# Patient Record
Sex: Female | Born: 1980
Health system: Southern US, Community
[De-identification: ages and names within clinical notes are randomized; demographics above are authoritative.]

## PROBLEM LIST (undated history)

## (undated) DIAGNOSIS — Z9889 Other specified postprocedural states: Secondary | ICD-10-CM

## (undated) DIAGNOSIS — R112 Nausea with vomiting, unspecified: Secondary | ICD-10-CM

## (undated) DIAGNOSIS — D649 Anemia, unspecified: Secondary | ICD-10-CM

## (undated) DIAGNOSIS — Z789 Other specified health status: Secondary | ICD-10-CM

## (undated) DIAGNOSIS — R011 Cardiac murmur, unspecified: Secondary | ICD-10-CM

## (undated) DIAGNOSIS — K219 Gastro-esophageal reflux disease without esophagitis: Secondary | ICD-10-CM

## (undated) DIAGNOSIS — I82409 Acute embolism and thrombosis of unspecified deep veins of unspecified lower extremity: Secondary | ICD-10-CM

## (undated) DIAGNOSIS — J302 Other seasonal allergic rhinitis: Secondary | ICD-10-CM

## (undated) HISTORY — PX: TUBAL LIGATION: SHX77

## (undated) HISTORY — PX: OTHER SURGICAL HISTORY: SHX169

---

## 2008-04-16 ENCOUNTER — Other Ambulatory Visit: Admission: RE | Admit: 2008-04-16 | Discharge: 2008-04-16 | Payer: Self-pay | Admitting: Obstetrics and Gynecology

## 2016-01-06 ENCOUNTER — Other Ambulatory Visit: Payer: Self-pay

## 2016-01-06 DIAGNOSIS — Z1231 Encounter for screening mammogram for malignant neoplasm of breast: Secondary | ICD-10-CM

## 2016-02-10 ENCOUNTER — Ambulatory Visit: Payer: Self-pay

## 2016-02-13 DIAGNOSIS — N915 Oligomenorrhea, unspecified: Secondary | ICD-10-CM | POA: Diagnosis not present

## 2016-02-17 ENCOUNTER — Ambulatory Visit
Admission: RE | Admit: 2016-02-17 | Discharge: 2016-02-17 | Disposition: A | Discharge status: Home / Self Care | Payer: BLUE CROSS/BLUE SHIELD | Source: Ambulatory Visit

## 2016-02-17 DIAGNOSIS — Z1231 Encounter for screening mammogram for malignant neoplasm of breast: Secondary | ICD-10-CM | POA: Diagnosis not present

## 2016-06-05 DIAGNOSIS — O3481 Maternal care for other abnormalities of pelvic organs, first trimester: Secondary | ICD-10-CM | POA: Diagnosis not present

## 2016-06-05 DIAGNOSIS — O30191 Triplet pregnancy, unable to determine number of placenta and number of amniotic sacs, first trimester: Secondary | ICD-10-CM | POA: Diagnosis not present

## 2016-06-05 DIAGNOSIS — Z36 Encounter for antenatal screening of mother: Secondary | ICD-10-CM | POA: Diagnosis not present

## 2016-06-05 DIAGNOSIS — O30121 Triplet pregnancy with two or more monoamniotic fetuses, first trimester: Secondary | ICD-10-CM | POA: Diagnosis not present

## 2016-06-05 DIAGNOSIS — Z3A01 Less than 8 weeks gestation of pregnancy: Secondary | ICD-10-CM | POA: Diagnosis not present

## 2016-06-15 DIAGNOSIS — O30191 Triplet pregnancy, unable to determine number of placenta and number of amniotic sacs, first trimester: Secondary | ICD-10-CM | POA: Diagnosis not present

## 2016-06-15 DIAGNOSIS — Z3A08 8 weeks gestation of pregnancy: Secondary | ICD-10-CM | POA: Diagnosis not present

## 2016-06-15 DIAGNOSIS — O30121 Triplet pregnancy with two or more monoamniotic fetuses, first trimester: Secondary | ICD-10-CM | POA: Diagnosis not present

## 2016-06-16 ENCOUNTER — Other Ambulatory Visit (HOSPITAL_COMMUNITY): Payer: Self-pay | Admitting: Unknown Physician Specialty

## 2016-06-16 DIAGNOSIS — Z3A16 16 weeks gestation of pregnancy: Secondary | ICD-10-CM

## 2016-06-16 DIAGNOSIS — Z3689 Encounter for other specified antenatal screening: Secondary | ICD-10-CM

## 2016-06-16 DIAGNOSIS — O09522 Supervision of elderly multigravida, second trimester: Secondary | ICD-10-CM

## 2016-07-08 DIAGNOSIS — Z3682 Encounter for antenatal screening for nuchal translucency: Secondary | ICD-10-CM | POA: Diagnosis not present

## 2016-07-08 DIAGNOSIS — Z3A11 11 weeks gestation of pregnancy: Secondary | ICD-10-CM | POA: Diagnosis not present

## 2016-07-08 DIAGNOSIS — O30121 Triplet pregnancy with two or more monoamniotic fetuses, first trimester: Secondary | ICD-10-CM | POA: Diagnosis not present

## 2016-07-08 DIAGNOSIS — Z369 Encounter for antenatal screening, unspecified: Secondary | ICD-10-CM | POA: Diagnosis not present

## 2016-07-08 DIAGNOSIS — O30101 Triplet pregnancy, unspecified number of placenta and unspecified number of amniotic sacs, first trimester: Secondary | ICD-10-CM | POA: Diagnosis not present

## 2016-07-08 DIAGNOSIS — O09511 Supervision of elderly primigravida, first trimester: Secondary | ICD-10-CM | POA: Diagnosis not present

## 2016-07-08 DIAGNOSIS — Z3A12 12 weeks gestation of pregnancy: Secondary | ICD-10-CM | POA: Diagnosis not present

## 2016-07-27 ENCOUNTER — Encounter (HOSPITAL_COMMUNITY): Payer: Self-pay | Admitting: Unknown Physician Specialty

## 2016-08-06 ENCOUNTER — Encounter (HOSPITAL_COMMUNITY): Payer: Self-pay

## 2016-08-07 ENCOUNTER — Encounter (HOSPITAL_COMMUNITY): Payer: Self-pay

## 2016-08-07 ENCOUNTER — Other Ambulatory Visit (HOSPITAL_COMMUNITY): Payer: Self-pay | Admitting: *Deleted

## 2016-08-07 ENCOUNTER — Ambulatory Visit (HOSPITAL_COMMUNITY)
Admission: RE | Admit: 2016-08-07 | Discharge: 2016-08-07 | Disposition: A | Discharge status: Home / Self Care | Payer: BLUE CROSS/BLUE SHIELD | Source: Ambulatory Visit | Attending: Unknown Physician Specialty | Admitting: Unknown Physician Specialty

## 2016-08-07 DIAGNOSIS — Z3A16 16 weeks gestation of pregnancy: Secondary | ICD-10-CM | POA: Insufficient documentation

## 2016-08-07 DIAGNOSIS — Z3689 Encounter for other specified antenatal screening: Secondary | ICD-10-CM

## 2016-08-07 DIAGNOSIS — O09522 Supervision of elderly multigravida, second trimester: Secondary | ICD-10-CM | POA: Diagnosis not present

## 2016-08-07 DIAGNOSIS — O30119 Triplet pregnancy with two or more monochorionic fetuses, unspecified trimester: Secondary | ICD-10-CM

## 2016-08-07 DIAGNOSIS — Z315 Encounter for genetic counseling: Secondary | ICD-10-CM | POA: Diagnosis not present

## 2016-08-07 DIAGNOSIS — Z148 Genetic carrier of other disease: Secondary | ICD-10-CM

## 2016-08-07 DIAGNOSIS — O30112 Triplet pregnancy with two or more monochorionic fetuses, second trimester: Secondary | ICD-10-CM | POA: Diagnosis not present

## 2016-08-07 DIAGNOSIS — O09529 Supervision of elderly multigravida, unspecified trimester: Secondary | ICD-10-CM

## 2016-08-07 HISTORY — DX: Other specified health status: Z78.9

## 2016-08-07 NOTE — Addendum Note (Signed)
Encounter addended by: Allegra LaiMark Glidwell Newman, MD on: 08/07/2016  4:56 PM<BR>    Actions taken: Sign clinical note

## 2016-08-07 NOTE — Progress Notes (Signed)
Maternal Fetal Medicine Consultation  Requesting Provider(s): Buist  Primary Ob: BuistReason for consultation: Triplet gestation  HPI: 35yo P2002 at 16+0 weeks with what appears to be a triamniotic dichorionic triplet gestation, with A & C being the monochorionic pain (see US report) these are spontaneous triplets. She has had about a 8-9 pound weight gain so far. She has had no problems with the pregnancy so far, and reports good fetal movement OB History: OB History    Gravida Para Term Preterm AB Living   3 2 2     2    SAB TAB Ectopic Multiple Live Births                Both of her pregnancies were uncomplicated, with no PIH or gestational diabetes  PMH:  Past Medical History:  Diagnosis Date  . Medical history non-contributory     PSH:  Past Surgical History:  Procedure Laterality Date  . bunionectomies     x2   Meds: PNV, promethazine Allergies: codeine Fh: Both children have Pendred Syndrome  Soc: No tocacco, ETOH or illicit drugs  Review of Systems: no vaginal bleeding or cramping/contractions, no LOF, no nausea/vomiting. All other systems reviewed and are negative.  Pe: See EPIC section for VS and PE    Please see separate document for fetal ultrasound report.  A/P: Triamniotic diamniotic triplet gestation I had a long discussion with the patient and her partner, discussing the various areas on increased risk for pregnancy complications. Chief among these is the risk for twin-to-twin transfusion between A & C. This will require visits for US evaluation every 2 weeks, with growth evaluated every other visit (every 4 weeks). BPP weekly at 28 weeks should be strongly considered.Should greater than stage 1 TTTS develop she would require evaluation by a high-volume fetal surgery practice. The risk for this is between 10-15%. She is at greater risk for preeclampsia, so I have asked her to start 81mg  of aspirin daily, starting today. Baseline labs should be obtained for  urinary protein and liver function test. She is also at greater risk for gestational diabetes. I would recommend a one hour GTT as soon as possible, with a repeat at 26 weeks if negative. She is at a higher risk for preterm delivery, but unfortunately no currently available interventions are shown to work in higher order multiples. Her weight gain is inadequate. For her BMI a total weight gain of 54 pounds is appropriate. I encouraged her to increase her intake of high density high quality calories with a good percentage of protein. Should there be no complications, delivery should be planned for between 35+0 and 35+6 weeks. After a betamethasone course.  Thank you for the opportunity to be a part of the care of Ann FreshwaterJennifer Leach. Please contact our office if we can be of further assistance.   I spent approximately 40 minutes with this patient with over 50% of time spent in face-to-face counseling.

## 2016-08-07 NOTE — Progress Notes (Signed)
Genetic Counseling  High-Risk Gestation Note  Appointment Date:  08/07/2016 Referred By: Ernestina PennaBuist, Nigel, MD Date of Birth:  Feb 18, 1981 Partner:  Miki KinsJason Ent   Pregnancy History: [id number]: G3P2002 Estimated Date of Delivery: 01/22/17 Estimated Gestational Age: 10223w0d Attending: Charlsie MerlesMark Newman, MD  Mrs. Ova FreshwaterJennifer Mowers and her husband, Mr. Miki KinsJason Hail, were seen for genetic counseling because of a maternal age of [age] y.o. in a dichorionic triamniotic triplet gestation.      In summary:  Discussed AMA and associated risk for fetal aneuploidy  Discussed options for screening  NIPS-declined   Ultrasound- performed today, follow-up ultrasounds scheduled  Discussed diagnostic testing options  Amniocentesis-declined  Reviewed family history concerns  Previous two biological daughters with Pendred syndrome, [facility]  Family history not reviewed in detail given that the family is followed by medical genetics  Reviewed autosomal recessive inheritance and 25% recurrence risk for current pregnancy   Couple had previously decided against prenatal diagnosis for Pendred syndrome in pregnancy  They plan to pursue postnatal evaluation for Pendred syndrome  Discussed carrier screening options (CF, SMA, hemoglobinopathies) - patient declined  They were counseled regarding maternal age and the association with risk for chromosome conditions due to nondisjunction with aging of the ova.   We reviewed chromosomes, nondisjunction, and the associated 1 in 8456 risk for fetal aneuploidy related to a maternal age of 35 y.o. at 2823w0d in a multifetal gestation. Regarding genetic information, we discussed that the monochorionic, diamniotic pair in the pregnancy would be expected to share the same chromosome information.  They were counseled that the risk for aneuploidy decreases as gestational age increases, accounting for those pregnancies which spontaneously abort.  We  specifically discussed Down syndrome (trisomy 6121), trisomies 2713 and 4018, and sex chromosome aneuploidies (47,XXX and 47,XXY) including the common features and prognoses of each.   We reviewed available screening options including noninvasive prenatal screening (NIPS)/cell free DNA (cfDNA) screening and detailed ultrasound.  We discussed the maternal serum screening is not available in a triplet gestation. They were counseled that screening tests are used to modify a patient's a priori risk for aneuploidy, typically based on age. This estimate provides a pregnancy specific risk assessment. We reviewed the benefits and limitations of each option. Specifically, we discussed the conditions for which each test screens, the detection rates, and false positive rates of each. We reviewed that while technically validated in a triplet gestation through [facility], performance data for NIPS in a triplet gestation is very limited. Sensitivity and specificity would be expected to be decreased compared to a singleton gestation. They understand that NIPS does not provide separate risk assessment for each fetus but rather risk assessment for each chromosome conditions to the pregnancy as a whole. They were also counseled regarding diagnostic testing via amniocentesis. We reviewed the approximate 1 in 300-500 risk for complications from amniocentesis, including spontaneous pregnancy loss. We discussed the possible results that the tests might provide including: positive, negative, unanticipated, and no result. Finally, they were counseled regarding the cost of each option and potential out of pocket expenses. After consideration of all the options, they elected to proceed with ultrasound only and declined NIPS and amniocentesis.     A complete ultrasound was performed today. The ultrasound report will be sent under separate cover. There were no visualized fetal anomalies or markers suggestive of aneuploidy, but  ultrasound was limited by gestational age. They understand that screening tests cannot rule out all birth defects or genetic syndromes.  The patient was advised of this limitation and states she still does not want additional testing at this time. Follow-up ultrasounds are planned for 11/27 and 12/08.   Mrs. Ova FreshwaterJennifer Madonia was provided with written information regarding cystic fibrosis (CF), spinal muscular atrophy (SMA) and hemoglobinopathies including the carrier frequency, availability of carrier screening and prenatal diagnosis if indicated.  In addition, we discussed that CF and hemoglobinopathies are routinely screened for as part of the [place] newborn screening panel.  She declined screening for CF, SMA and hemoglobinopathies.  Both family histories were briefly reviewed and found to be contributory for Pendred syndrome for the couple's two biological daughters. Their [facility] are [facility]. They also reported that their adopted daughter has hearing loss due to microtia. Given this family's close follow-up and no recent changes to the family history, the couple declined detailed family history intake at this time.  Pendred syndrome is part of a phenotypic spectrum of sensorineural hearing loss (SNHL), which is typically congenital. Additional features of Pendred syndrome include vestibular dysfunction, temporal bone abnormalities, and increased risk for development of euthyroid goiter.  Pendred syndrome is an autosomal recessive genetic condition. The majority of individuals have homozygous pathogenic variants in SLC26A4, but some individuals are found to have pathogenic variants in SLC26A4 in combination with pathogenic variants in either FOXI1 or KCNJ10. The couple was familiar with autosomal recessive inheritance, where both parents who are carriers for an autosomal recessive condition have an independent 1 in 4 (25%) chance for a pregnancy inheriting both copies of the  pathogenic gene changes. Additionally, each pregnancy together has 1 in 2 (50%) chance to be a carrier, and a 1 in 4 (25%) chance to be neither a carrier nor affected. In the current triplet gestation for the couple, they understand that the monochorionic,diamniotic fetus pair would be expected to have the same genetic information, and the third fetus would have a separate 25% risk for Pendred syndrome.   The couple discussed the 25% recurrence risk for Pendred syndrome with the pediatric genetic counselor at [facility] and had previously decided to decline prenatal diagnosis for Pendred syndrome. They plan for postnatal evaluation for Pendred syndrome at this time.  Without further information regarding the provided family history, an accurate genetic risk cannot be calculated. Further genetic counseling is warranted if more information is obtained.  Mrs. Ova FreshwaterJennifer Rippetoe denied exposure to environmental toxins or chemical agents. She denied the use of alcohol, tobacco or street drugs. She denied significant viral illnesses during the course of her pregnancy. Her medical and surgical histories were otherwise noncontributory.   I counseled this couple regarding the above risks and available options.  The approximate face-to-face time with the genetic counselor was 35 minutes.  Quinn PlowmanKaren Venissa Nappi, MS,  Certified Genetic Counselor 08/07/2016

## 2016-08-07 NOTE — Progress Notes (Signed)
Addendum: the first line of assessment/plan should read "triamniotic Dichorionic"

## 2016-08-10 ENCOUNTER — Encounter (HOSPITAL_COMMUNITY): Payer: Self-pay

## 2016-08-10 DIAGNOSIS — Z3A16 16 weeks gestation of pregnancy: Secondary | ICD-10-CM | POA: Insufficient documentation

## 2016-08-10 DIAGNOSIS — O09529 Supervision of elderly multigravida, unspecified trimester: Secondary | ICD-10-CM | POA: Insufficient documentation

## 2016-08-21 ENCOUNTER — Ambulatory Visit (HOSPITAL_COMMUNITY): Payer: BLUE CROSS/BLUE SHIELD

## 2016-08-24 ENCOUNTER — Ambulatory Visit (HOSPITAL_COMMUNITY)
Admission: RE | Admit: 2016-08-24 | Discharge: 2016-08-24 | Disposition: A | Discharge status: Home / Self Care | Payer: BLUE CROSS/BLUE SHIELD | Source: Ambulatory Visit | Attending: Unknown Physician Specialty | Admitting: Unknown Physician Specialty

## 2016-08-24 ENCOUNTER — Other Ambulatory Visit (HOSPITAL_COMMUNITY): Payer: Self-pay | Admitting: Obstetrics and Gynecology

## 2016-08-24 ENCOUNTER — Other Ambulatory Visit (HOSPITAL_COMMUNITY): Payer: Self-pay | Admitting: *Deleted

## 2016-08-24 ENCOUNTER — Encounter (HOSPITAL_COMMUNITY): Payer: Self-pay

## 2016-08-24 DIAGNOSIS — O30112 Triplet pregnancy with two or more monochorionic fetuses, second trimester: Secondary | ICD-10-CM | POA: Diagnosis not present

## 2016-08-24 DIAGNOSIS — Z8489 Family history of other specified conditions: Secondary | ICD-10-CM | POA: Diagnosis not present

## 2016-08-24 DIAGNOSIS — Z3A18 18 weeks gestation of pregnancy: Secondary | ICD-10-CM | POA: Diagnosis not present

## 2016-08-24 DIAGNOSIS — O30119 Triplet pregnancy with two or more monochorionic fetuses, unspecified trimester: Secondary | ICD-10-CM

## 2016-08-24 DIAGNOSIS — O09522 Supervision of elderly multigravida, second trimester: Secondary | ICD-10-CM | POA: Diagnosis not present

## 2016-09-04 ENCOUNTER — Encounter (HOSPITAL_COMMUNITY): Payer: Self-pay

## 2016-09-04 ENCOUNTER — Other Ambulatory Visit (HOSPITAL_COMMUNITY): Payer: Self-pay | Admitting: Obstetrics and Gynecology

## 2016-09-04 ENCOUNTER — Ambulatory Visit (HOSPITAL_COMMUNITY)
Admission: RE | Admit: 2016-09-04 | Discharge: 2016-09-04 | Disposition: A | Discharge status: Home / Self Care | Payer: BLUE CROSS/BLUE SHIELD | Source: Ambulatory Visit | Attending: Unknown Physician Specialty | Admitting: Unknown Physician Specialty

## 2016-09-04 DIAGNOSIS — Z3A20 20 weeks gestation of pregnancy: Secondary | ICD-10-CM

## 2016-09-04 DIAGNOSIS — O09522 Supervision of elderly multigravida, second trimester: Secondary | ICD-10-CM | POA: Diagnosis not present

## 2016-09-04 DIAGNOSIS — Z362 Encounter for other antenatal screening follow-up: Secondary | ICD-10-CM

## 2016-09-04 DIAGNOSIS — O30119 Triplet pregnancy with two or more monochorionic fetuses, unspecified trimester: Secondary | ICD-10-CM

## 2016-09-04 DIAGNOSIS — O30112 Triplet pregnancy with two or more monochorionic fetuses, second trimester: Secondary | ICD-10-CM | POA: Diagnosis not present

## 2016-09-18 ENCOUNTER — Encounter (HOSPITAL_COMMUNITY): Payer: Self-pay

## 2016-09-18 ENCOUNTER — Ambulatory Visit (HOSPITAL_COMMUNITY)
Admission: RE | Admit: 2016-09-18 | Discharge: 2016-09-18 | Disposition: A | Discharge status: Home / Self Care | Payer: BLUE CROSS/BLUE SHIELD | Source: Ambulatory Visit | Attending: Unknown Physician Specialty | Admitting: Unknown Physician Specialty

## 2016-09-18 DIAGNOSIS — Z3A22 22 weeks gestation of pregnancy: Secondary | ICD-10-CM | POA: Diagnosis not present

## 2016-09-18 DIAGNOSIS — O30112 Triplet pregnancy with two or more monochorionic fetuses, second trimester: Secondary | ICD-10-CM | POA: Insufficient documentation

## 2016-09-18 NOTE — Addendum Note (Signed)
Encounter addended by: Emeline DarlingKasie E Wrigley Winborne on: 09/18/2016 12:00 PM<BR>    Actions taken: Imaging Exam ended

## 2016-10-02 ENCOUNTER — Other Ambulatory Visit (HOSPITAL_COMMUNITY): Payer: Self-pay | Admitting: Maternal and Fetal Medicine

## 2016-10-02 ENCOUNTER — Ambulatory Visit (HOSPITAL_COMMUNITY)
Admission: RE | Admit: 2016-10-02 | Discharge: 2016-10-02 | Disposition: A | Discharge status: Home / Self Care | Payer: BLUE CROSS/BLUE SHIELD | Source: Ambulatory Visit | Attending: Unknown Physician Specialty | Admitting: Unknown Physician Specialty

## 2016-10-02 ENCOUNTER — Encounter (HOSPITAL_COMMUNITY): Payer: Self-pay

## 2016-10-02 DIAGNOSIS — O30112 Triplet pregnancy with two or more monochorionic fetuses, second trimester: Secondary | ICD-10-CM | POA: Insufficient documentation

## 2016-10-02 DIAGNOSIS — O09522 Supervision of elderly multigravida, second trimester: Secondary | ICD-10-CM

## 2016-10-02 DIAGNOSIS — O30122 Triplet pregnancy with two or more monoamniotic fetuses, second trimester: Secondary | ICD-10-CM

## 2016-10-02 DIAGNOSIS — Z3A24 24 weeks gestation of pregnancy: Secondary | ICD-10-CM

## 2016-10-02 DIAGNOSIS — Z8489 Family history of other specified conditions: Secondary | ICD-10-CM

## 2016-10-16 ENCOUNTER — Ambulatory Visit (HOSPITAL_COMMUNITY)
Admission: RE | Admit: 2016-10-16 | Discharge: 2016-10-16 | Disposition: A | Discharge status: Home / Self Care | Payer: BLUE CROSS/BLUE SHIELD | Source: Ambulatory Visit | Attending: Unknown Physician Specialty | Admitting: Unknown Physician Specialty

## 2016-10-19 ENCOUNTER — Ambulatory Visit (HOSPITAL_COMMUNITY)
Admission: RE | Admit: 2016-10-19 | Discharge: 2016-10-19 | Disposition: A | Discharge status: Home / Self Care | Payer: BLUE CROSS/BLUE SHIELD | Source: Ambulatory Visit | Attending: Unknown Physician Specialty | Admitting: Unknown Physician Specialty

## 2016-10-19 ENCOUNTER — Encounter (HOSPITAL_COMMUNITY): Payer: Self-pay

## 2016-10-19 DIAGNOSIS — O30112 Triplet pregnancy with two or more monochorionic fetuses, second trimester: Secondary | ICD-10-CM | POA: Diagnosis not present

## 2016-10-19 DIAGNOSIS — Z3A26 26 weeks gestation of pregnancy: Secondary | ICD-10-CM | POA: Insufficient documentation

## 2016-10-19 DIAGNOSIS — O09522 Supervision of elderly multigravida, second trimester: Secondary | ICD-10-CM | POA: Diagnosis not present

## 2016-10-28 DIAGNOSIS — O30102 Triplet pregnancy, unspecified number of placenta and unspecified number of amniotic sacs, second trimester: Secondary | ICD-10-CM | POA: Diagnosis not present

## 2016-10-30 ENCOUNTER — Other Ambulatory Visit (HOSPITAL_COMMUNITY): Payer: Self-pay | Admitting: Maternal and Fetal Medicine

## 2016-10-30 ENCOUNTER — Encounter (HOSPITAL_COMMUNITY): Payer: Self-pay

## 2016-10-30 ENCOUNTER — Ambulatory Visit (HOSPITAL_COMMUNITY)
Admission: RE | Admit: 2016-10-30 | Discharge: 2016-10-30 | Disposition: A | Discharge status: Home / Self Care | Payer: BLUE CROSS/BLUE SHIELD | Source: Ambulatory Visit | Attending: Unknown Physician Specialty | Admitting: Unknown Physician Specialty

## 2016-10-30 DIAGNOSIS — O09523 Supervision of elderly multigravida, third trimester: Secondary | ICD-10-CM

## 2016-10-30 DIAGNOSIS — O30112 Triplet pregnancy with two or more monochorionic fetuses, second trimester: Secondary | ICD-10-CM

## 2016-10-30 DIAGNOSIS — Z8489 Family history of other specified conditions: Secondary | ICD-10-CM | POA: Diagnosis not present

## 2016-10-30 DIAGNOSIS — Z3A28 28 weeks gestation of pregnancy: Secondary | ICD-10-CM | POA: Diagnosis not present

## 2016-10-30 DIAGNOSIS — O30113 Triplet pregnancy with two or more monochorionic fetuses, third trimester: Secondary | ICD-10-CM | POA: Insufficient documentation

## 2016-11-08 DIAGNOSIS — Z3483 Encounter for supervision of other normal pregnancy, third trimester: Secondary | ICD-10-CM | POA: Diagnosis not present

## 2016-11-08 DIAGNOSIS — O2690 Pregnancy related conditions, unspecified, unspecified trimester: Secondary | ICD-10-CM | POA: Diagnosis not present

## 2016-11-08 DIAGNOSIS — O2343 Unspecified infection of urinary tract in pregnancy, third trimester: Secondary | ICD-10-CM | POA: Diagnosis not present

## 2016-11-08 DIAGNOSIS — Z23 Encounter for immunization: Secondary | ICD-10-CM | POA: Diagnosis not present

## 2016-11-08 DIAGNOSIS — O30113 Triplet pregnancy with two or more monochorionic fetuses, third trimester: Secondary | ICD-10-CM | POA: Diagnosis not present

## 2016-11-08 DIAGNOSIS — O361930 Maternal care for other isoimmunization, third trimester, not applicable or unspecified: Secondary | ICD-10-CM | POA: Diagnosis not present

## 2016-11-08 DIAGNOSIS — O09523 Supervision of elderly multigravida, third trimester: Secondary | ICD-10-CM | POA: Diagnosis not present

## 2016-11-08 DIAGNOSIS — Z3A31 31 weeks gestation of pregnancy: Secondary | ICD-10-CM | POA: Diagnosis not present

## 2016-11-08 DIAGNOSIS — Z3A29 29 weeks gestation of pregnancy: Secondary | ICD-10-CM | POA: Diagnosis not present

## 2016-11-08 DIAGNOSIS — O3433 Maternal care for cervical incompetence, third trimester: Secondary | ICD-10-CM | POA: Diagnosis not present

## 2016-11-08 DIAGNOSIS — O30103 Triplet pregnancy, unspecified number of placenta and unspecified number of amniotic sacs, third trimester: Secondary | ICD-10-CM | POA: Diagnosis not present

## 2016-11-08 DIAGNOSIS — B9689 Other specified bacterial agents as the cause of diseases classified elsewhere: Secondary | ICD-10-CM | POA: Diagnosis not present

## 2016-11-08 DIAGNOSIS — Z8481 Family history of carrier of genetic disease: Secondary | ICD-10-CM | POA: Diagnosis not present

## 2016-11-08 DIAGNOSIS — O365931 Maternal care for other known or suspected poor fetal growth, third trimester, fetus 1: Secondary | ICD-10-CM | POA: Diagnosis not present

## 2016-11-09 DIAGNOSIS — O30103 Triplet pregnancy, unspecified number of placenta and unspecified number of amniotic sacs, third trimester: Secondary | ICD-10-CM | POA: Diagnosis not present

## 2016-11-09 DIAGNOSIS — Z3A29 29 weeks gestation of pregnancy: Secondary | ICD-10-CM | POA: Diagnosis not present

## 2016-11-09 DIAGNOSIS — O361930 Maternal care for other isoimmunization, third trimester, not applicable or unspecified: Secondary | ICD-10-CM | POA: Diagnosis not present

## 2016-11-09 DIAGNOSIS — O3433 Maternal care for cervical incompetence, third trimester: Secondary | ICD-10-CM | POA: Diagnosis not present

## 2016-11-10 DIAGNOSIS — O361930 Maternal care for other isoimmunization, third trimester, not applicable or unspecified: Secondary | ICD-10-CM | POA: Diagnosis not present

## 2016-11-10 DIAGNOSIS — O30103 Triplet pregnancy, unspecified number of placenta and unspecified number of amniotic sacs, third trimester: Secondary | ICD-10-CM | POA: Diagnosis not present

## 2016-11-10 DIAGNOSIS — Z3A29 29 weeks gestation of pregnancy: Secondary | ICD-10-CM | POA: Diagnosis not present

## 2016-11-11 DIAGNOSIS — O361930 Maternal care for other isoimmunization, third trimester, not applicable or unspecified: Secondary | ICD-10-CM | POA: Diagnosis not present

## 2016-11-11 DIAGNOSIS — Z3A29 29 weeks gestation of pregnancy: Secondary | ICD-10-CM | POA: Diagnosis not present

## 2016-11-11 DIAGNOSIS — O30103 Triplet pregnancy, unspecified number of placenta and unspecified number of amniotic sacs, third trimester: Secondary | ICD-10-CM | POA: Diagnosis not present

## 2016-11-12 DIAGNOSIS — O09523 Supervision of elderly multigravida, third trimester: Secondary | ICD-10-CM | POA: Diagnosis not present

## 2016-11-12 DIAGNOSIS — Z3A29 29 weeks gestation of pregnancy: Secondary | ICD-10-CM | POA: Diagnosis not present

## 2016-11-12 DIAGNOSIS — Z8481 Family history of carrier of genetic disease: Secondary | ICD-10-CM | POA: Diagnosis not present

## 2016-11-12 DIAGNOSIS — O361930 Maternal care for other isoimmunization, third trimester, not applicable or unspecified: Secondary | ICD-10-CM | POA: Diagnosis not present

## 2016-11-12 DIAGNOSIS — O30113 Triplet pregnancy with two or more monochorionic fetuses, third trimester: Secondary | ICD-10-CM | POA: Diagnosis not present

## 2016-11-12 DIAGNOSIS — O30103 Triplet pregnancy, unspecified number of placenta and unspecified number of amniotic sacs, third trimester: Secondary | ICD-10-CM | POA: Diagnosis not present

## 2016-11-13 ENCOUNTER — Ambulatory Visit (HOSPITAL_COMMUNITY): Payer: BLUE CROSS/BLUE SHIELD

## 2016-11-13 DIAGNOSIS — Z3A29 29 weeks gestation of pregnancy: Secondary | ICD-10-CM | POA: Diagnosis not present

## 2016-11-13 DIAGNOSIS — O361930 Maternal care for other isoimmunization, third trimester, not applicable or unspecified: Secondary | ICD-10-CM | POA: Diagnosis not present

## 2016-11-13 DIAGNOSIS — O30103 Triplet pregnancy, unspecified number of placenta and unspecified number of amniotic sacs, third trimester: Secondary | ICD-10-CM | POA: Diagnosis not present

## 2016-11-14 DIAGNOSIS — O361930 Maternal care for other isoimmunization, third trimester, not applicable or unspecified: Secondary | ICD-10-CM | POA: Diagnosis not present

## 2016-11-14 DIAGNOSIS — O30103 Triplet pregnancy, unspecified number of placenta and unspecified number of amniotic sacs, third trimester: Secondary | ICD-10-CM | POA: Diagnosis not present

## 2016-11-14 DIAGNOSIS — Z3A29 29 weeks gestation of pregnancy: Secondary | ICD-10-CM | POA: Diagnosis not present

## 2016-11-15 DIAGNOSIS — O361930 Maternal care for other isoimmunization, third trimester, not applicable or unspecified: Secondary | ICD-10-CM | POA: Diagnosis not present

## 2016-11-15 DIAGNOSIS — Z3A29 29 weeks gestation of pregnancy: Secondary | ICD-10-CM | POA: Diagnosis not present

## 2016-11-15 DIAGNOSIS — O30103 Triplet pregnancy, unspecified number of placenta and unspecified number of amniotic sacs, third trimester: Secondary | ICD-10-CM | POA: Diagnosis not present

## 2016-11-16 DIAGNOSIS — O30103 Triplet pregnancy, unspecified number of placenta and unspecified number of amniotic sacs, third trimester: Secondary | ICD-10-CM | POA: Diagnosis not present

## 2016-11-16 DIAGNOSIS — Z3A29 29 weeks gestation of pregnancy: Secondary | ICD-10-CM | POA: Diagnosis not present

## 2016-11-16 DIAGNOSIS — O361930 Maternal care for other isoimmunization, third trimester, not applicable or unspecified: Secondary | ICD-10-CM | POA: Diagnosis not present

## 2016-11-17 DIAGNOSIS — O30103 Triplet pregnancy, unspecified number of placenta and unspecified number of amniotic sacs, third trimester: Secondary | ICD-10-CM | POA: Diagnosis not present

## 2016-11-17 DIAGNOSIS — Z3A29 29 weeks gestation of pregnancy: Secondary | ICD-10-CM | POA: Diagnosis not present

## 2016-11-17 DIAGNOSIS — O361930 Maternal care for other isoimmunization, third trimester, not applicable or unspecified: Secondary | ICD-10-CM | POA: Diagnosis not present

## 2016-11-18 DIAGNOSIS — O361930 Maternal care for other isoimmunization, third trimester, not applicable or unspecified: Secondary | ICD-10-CM | POA: Diagnosis not present

## 2016-11-18 DIAGNOSIS — Z3A29 29 weeks gestation of pregnancy: Secondary | ICD-10-CM | POA: Diagnosis not present

## 2016-11-18 DIAGNOSIS — O30103 Triplet pregnancy, unspecified number of placenta and unspecified number of amniotic sacs, third trimester: Secondary | ICD-10-CM | POA: Diagnosis not present

## 2016-11-19 DIAGNOSIS — Z3A29 29 weeks gestation of pregnancy: Secondary | ICD-10-CM | POA: Diagnosis not present

## 2016-11-19 DIAGNOSIS — O361930 Maternal care for other isoimmunization, third trimester, not applicable or unspecified: Secondary | ICD-10-CM | POA: Diagnosis not present

## 2016-11-19 DIAGNOSIS — O30103 Triplet pregnancy, unspecified number of placenta and unspecified number of amniotic sacs, third trimester: Secondary | ICD-10-CM | POA: Diagnosis not present

## 2016-11-20 DIAGNOSIS — O361930 Maternal care for other isoimmunization, third trimester, not applicable or unspecified: Secondary | ICD-10-CM | POA: Diagnosis not present

## 2016-11-20 DIAGNOSIS — O30103 Triplet pregnancy, unspecified number of placenta and unspecified number of amniotic sacs, third trimester: Secondary | ICD-10-CM | POA: Diagnosis not present

## 2016-11-20 DIAGNOSIS — Z3A29 29 weeks gestation of pregnancy: Secondary | ICD-10-CM | POA: Diagnosis not present

## 2016-11-21 DIAGNOSIS — Z3A29 29 weeks gestation of pregnancy: Secondary | ICD-10-CM | POA: Diagnosis not present

## 2016-11-21 DIAGNOSIS — O30103 Triplet pregnancy, unspecified number of placenta and unspecified number of amniotic sacs, third trimester: Secondary | ICD-10-CM | POA: Diagnosis not present

## 2016-11-21 DIAGNOSIS — O361930 Maternal care for other isoimmunization, third trimester, not applicable or unspecified: Secondary | ICD-10-CM | POA: Diagnosis not present

## 2016-11-22 DIAGNOSIS — O361930 Maternal care for other isoimmunization, third trimester, not applicable or unspecified: Secondary | ICD-10-CM | POA: Diagnosis not present

## 2016-11-22 DIAGNOSIS — O30103 Triplet pregnancy, unspecified number of placenta and unspecified number of amniotic sacs, third trimester: Secondary | ICD-10-CM | POA: Diagnosis not present

## 2016-11-22 DIAGNOSIS — Z3A29 29 weeks gestation of pregnancy: Secondary | ICD-10-CM | POA: Diagnosis not present

## 2016-11-23 DIAGNOSIS — O361930 Maternal care for other isoimmunization, third trimester, not applicable or unspecified: Secondary | ICD-10-CM | POA: Diagnosis not present

## 2016-11-23 DIAGNOSIS — O30103 Triplet pregnancy, unspecified number of placenta and unspecified number of amniotic sacs, third trimester: Secondary | ICD-10-CM | POA: Diagnosis not present

## 2016-11-23 DIAGNOSIS — Z3A29 29 weeks gestation of pregnancy: Secondary | ICD-10-CM | POA: Diagnosis not present

## 2016-11-24 DIAGNOSIS — O30113 Triplet pregnancy with two or more monochorionic fetuses, third trimester: Secondary | ICD-10-CM | POA: Diagnosis not present

## 2016-11-24 DIAGNOSIS — O30103 Triplet pregnancy, unspecified number of placenta and unspecified number of amniotic sacs, third trimester: Secondary | ICD-10-CM | POA: Diagnosis not present

## 2016-11-24 DIAGNOSIS — O361930 Maternal care for other isoimmunization, third trimester, not applicable or unspecified: Secondary | ICD-10-CM | POA: Diagnosis not present

## 2016-11-24 DIAGNOSIS — Z8481 Family history of carrier of genetic disease: Secondary | ICD-10-CM | POA: Diagnosis not present

## 2016-11-24 DIAGNOSIS — Z3A29 29 weeks gestation of pregnancy: Secondary | ICD-10-CM | POA: Diagnosis not present

## 2016-11-24 DIAGNOSIS — O365931 Maternal care for other known or suspected poor fetal growth, third trimester, fetus 1: Secondary | ICD-10-CM | POA: Diagnosis not present

## 2016-11-24 DIAGNOSIS — O09523 Supervision of elderly multigravida, third trimester: Secondary | ICD-10-CM | POA: Diagnosis not present

## 2016-11-24 DIAGNOSIS — Z3A31 31 weeks gestation of pregnancy: Secondary | ICD-10-CM | POA: Diagnosis not present

## 2016-11-27 ENCOUNTER — Ambulatory Visit (HOSPITAL_COMMUNITY): Admission: RE | Admit: 2016-11-27 | Payer: BLUE CROSS/BLUE SHIELD | Source: Ambulatory Visit

## 2016-12-01 DIAGNOSIS — O09523 Supervision of elderly multigravida, third trimester: Secondary | ICD-10-CM | POA: Diagnosis not present

## 2016-12-01 DIAGNOSIS — O30123 Triplet pregnancy with two or more monoamniotic fetuses, third trimester: Secondary | ICD-10-CM | POA: Diagnosis not present

## 2016-12-01 DIAGNOSIS — O30113 Triplet pregnancy with two or more monochorionic fetuses, third trimester: Secondary | ICD-10-CM | POA: Diagnosis not present

## 2016-12-01 DIAGNOSIS — O365930 Maternal care for other known or suspected poor fetal growth, third trimester, not applicable or unspecified: Secondary | ICD-10-CM | POA: Diagnosis not present

## 2016-12-01 DIAGNOSIS — O0993 Supervision of high risk pregnancy, unspecified, third trimester: Secondary | ICD-10-CM | POA: Diagnosis not present

## 2016-12-01 DIAGNOSIS — Z302 Encounter for sterilization: Secondary | ICD-10-CM | POA: Diagnosis not present

## 2016-12-01 DIAGNOSIS — Z3A32 32 weeks gestation of pregnancy: Secondary | ICD-10-CM | POA: Diagnosis not present

## 2016-12-01 DIAGNOSIS — Z3689 Encounter for other specified antenatal screening: Secondary | ICD-10-CM | POA: Diagnosis not present

## 2016-12-01 DIAGNOSIS — O30103 Triplet pregnancy, unspecified number of placenta and unspecified number of amniotic sacs, third trimester: Secondary | ICD-10-CM | POA: Diagnosis not present

## 2016-12-02 DIAGNOSIS — O30113 Triplet pregnancy with two or more monochorionic fetuses, third trimester: Secondary | ICD-10-CM | POA: Diagnosis not present

## 2016-12-02 DIAGNOSIS — Z302 Encounter for sterilization: Secondary | ICD-10-CM | POA: Diagnosis not present

## 2016-12-02 DIAGNOSIS — Z3A32 32 weeks gestation of pregnancy: Secondary | ICD-10-CM | POA: Diagnosis not present

## 2016-12-02 DIAGNOSIS — O30123 Triplet pregnancy with two or more monoamniotic fetuses, third trimester: Secondary | ICD-10-CM | POA: Diagnosis not present

## 2017-05-07 DIAGNOSIS — Z6820 Body mass index (BMI) 20.0-20.9, adult: Secondary | ICD-10-CM | POA: Diagnosis not present

## 2017-05-07 DIAGNOSIS — K219 Gastro-esophageal reflux disease without esophagitis: Secondary | ICD-10-CM | POA: Diagnosis not present

## 2017-06-11 ENCOUNTER — Encounter (HOSPITAL_COMMUNITY): Payer: Self-pay

## 2017-07-31 DIAGNOSIS — M7551 Bursitis of right shoulder: Secondary | ICD-10-CM | POA: Diagnosis not present

## 2017-07-31 DIAGNOSIS — Z6820 Body mass index (BMI) 20.0-20.9, adult: Secondary | ICD-10-CM | POA: Diagnosis not present

## 2018-03-21 DIAGNOSIS — M7552 Bursitis of left shoulder: Secondary | ICD-10-CM | POA: Diagnosis not present

## 2018-03-21 DIAGNOSIS — F419 Anxiety disorder, unspecified: Secondary | ICD-10-CM | POA: Diagnosis not present

## 2018-03-21 DIAGNOSIS — Z6820 Body mass index (BMI) 20.0-20.9, adult: Secondary | ICD-10-CM | POA: Diagnosis not present

## 2018-04-18 DIAGNOSIS — Z01419 Encounter for gynecological examination (general) (routine) without abnormal findings: Secondary | ICD-10-CM | POA: Diagnosis not present

## 2018-04-18 DIAGNOSIS — Z1151 Encounter for screening for human papillomavirus (HPV): Secondary | ICD-10-CM | POA: Diagnosis not present

## 2018-05-11 DIAGNOSIS — Z30014 Encounter for initial prescription of intrauterine contraceptive device: Secondary | ICD-10-CM | POA: Diagnosis not present

## 2018-05-11 DIAGNOSIS — Z3202 Encounter for pregnancy test, result negative: Secondary | ICD-10-CM | POA: Diagnosis not present

## 2018-05-11 DIAGNOSIS — Z30430 Encounter for insertion of intrauterine contraceptive device: Secondary | ICD-10-CM | POA: Diagnosis not present

## 2018-07-13 DIAGNOSIS — Z23 Encounter for immunization: Secondary | ICD-10-CM | POA: Diagnosis not present

## 2018-09-07 DIAGNOSIS — R448 Other symptoms and signs involving general sensations and perceptions: Secondary | ICD-10-CM | POA: Diagnosis not present

## 2018-09-07 DIAGNOSIS — R202 Paresthesia of skin: Secondary | ICD-10-CM | POA: Diagnosis not present

## 2018-09-07 DIAGNOSIS — Z6820 Body mass index (BMI) 20.0-20.9, adult: Secondary | ICD-10-CM | POA: Diagnosis not present

## 2018-09-12 DIAGNOSIS — I82409 Acute embolism and thrombosis of unspecified deep veins of unspecified lower extremity: Secondary | ICD-10-CM | POA: Diagnosis not present

## 2018-09-12 DIAGNOSIS — T8332XA Displacement of intrauterine contraceptive device, initial encounter: Secondary | ICD-10-CM | POA: Diagnosis not present

## 2018-09-12 DIAGNOSIS — I82890 Acute embolism and thrombosis of other specified veins: Secondary | ICD-10-CM | POA: Diagnosis not present

## 2018-09-12 DIAGNOSIS — Z975 Presence of (intrauterine) contraceptive device: Secondary | ICD-10-CM | POA: Diagnosis not present

## 2018-09-12 DIAGNOSIS — R9389 Abnormal findings on diagnostic imaging of other specified body structures: Secondary | ICD-10-CM | POA: Diagnosis not present

## 2018-09-12 IMAGING — US US MFM OB LIMITED
1 series · 14 of 28 positions shown · non-contrast
Comparison: none

[Series 1: us mfm ob limited · 14 of 53 slices shown]
[im 2/53]
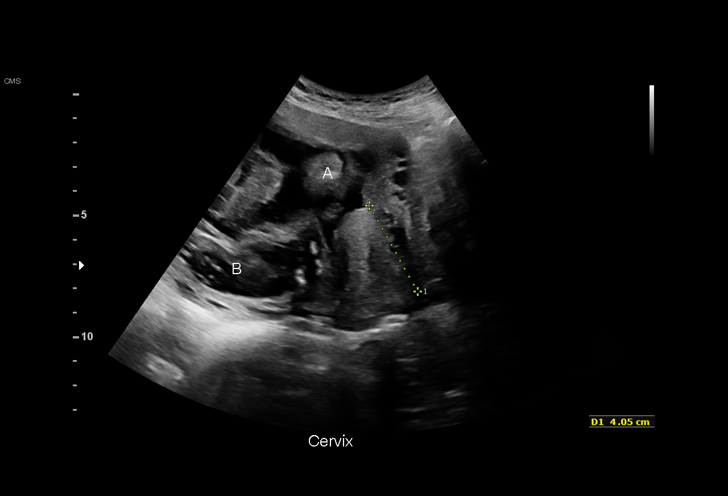
[im 6/53]
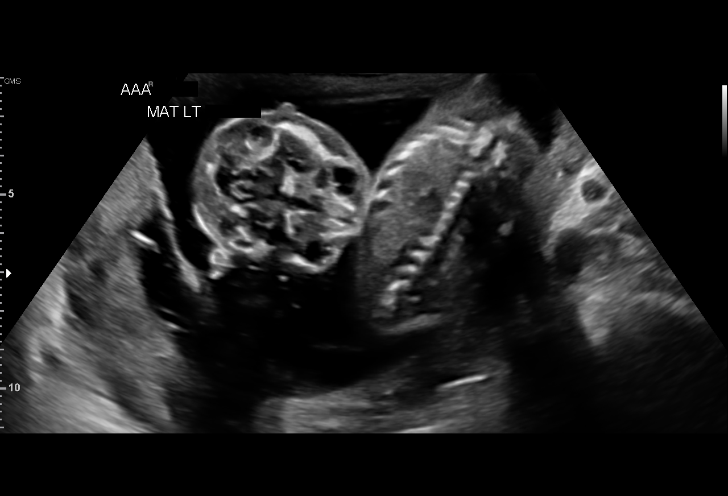
[im 10/53]
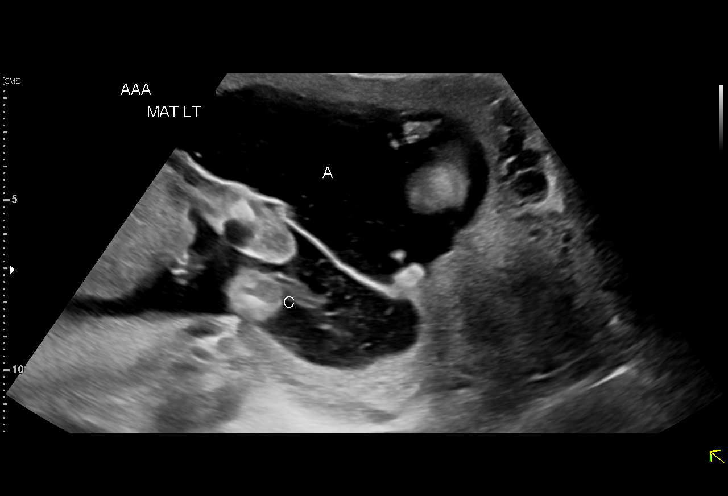
[im 14/53]
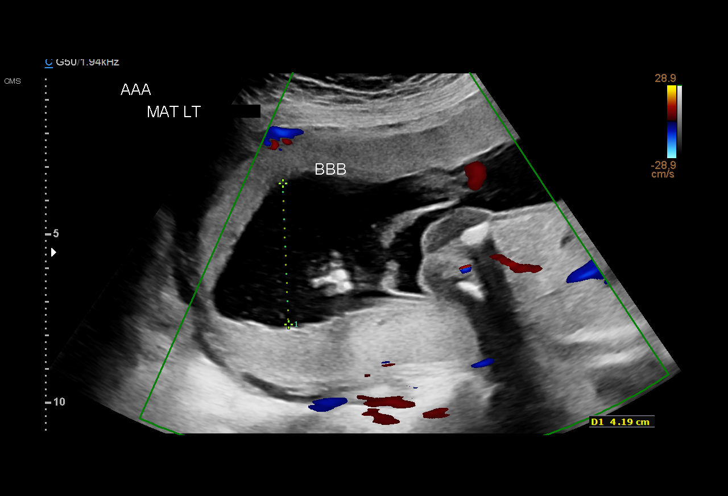
[im 18/53]
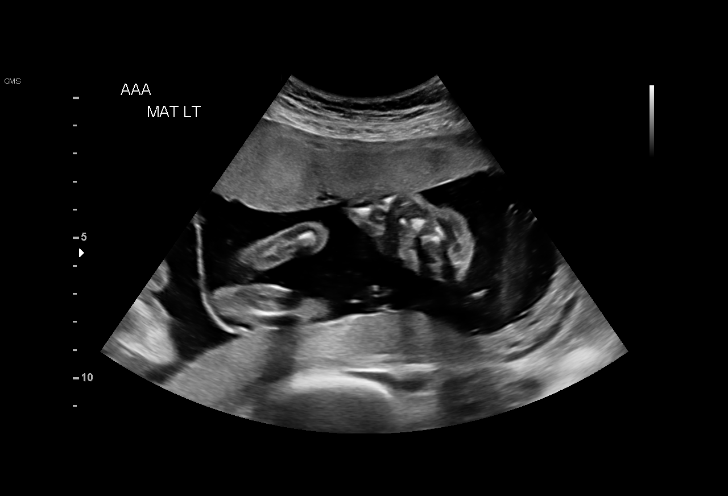
[im 22/53]
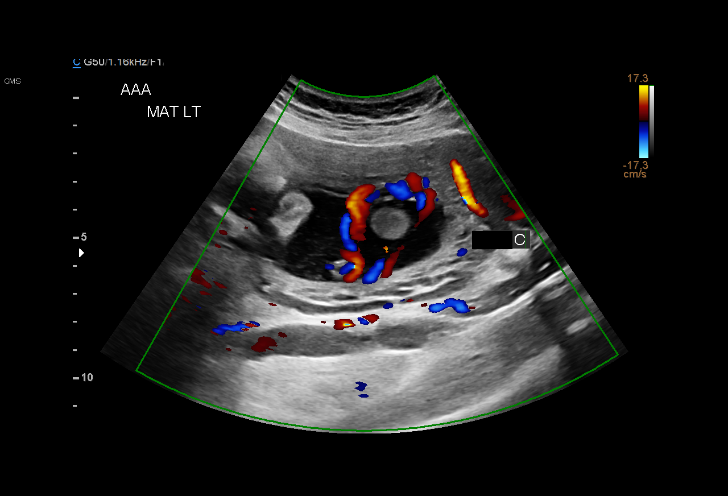
[im 26/53]
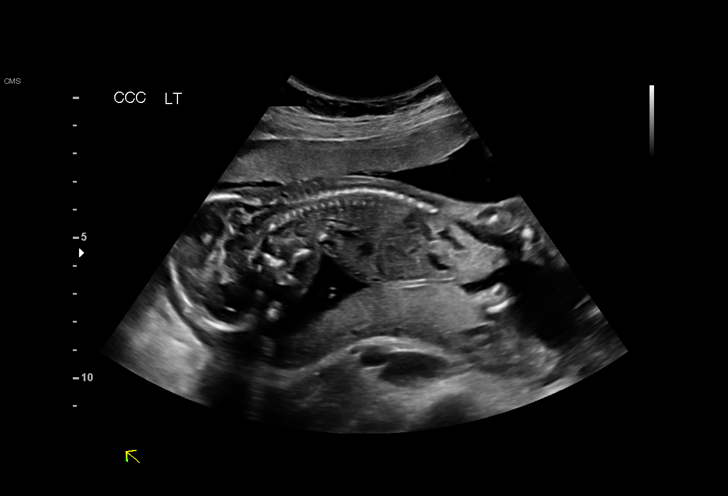
[im 29/53]
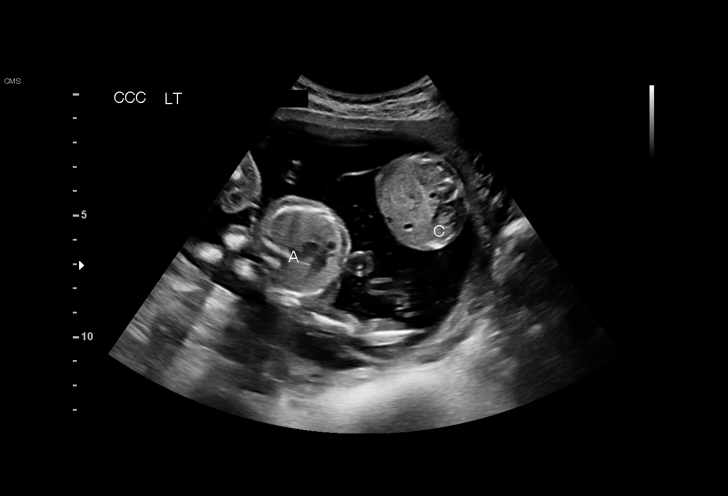
[im 33/53]
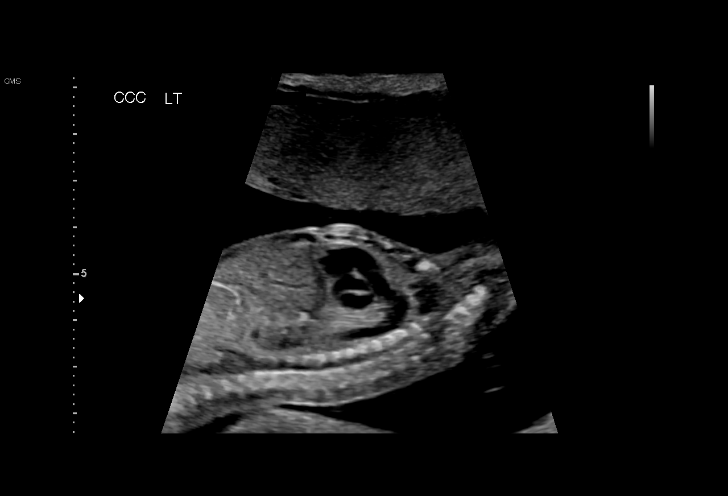
[im 37/53]
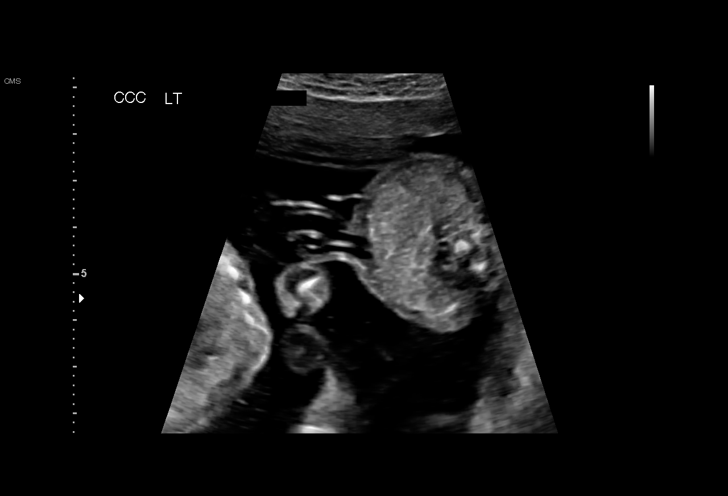
[im 41/53]
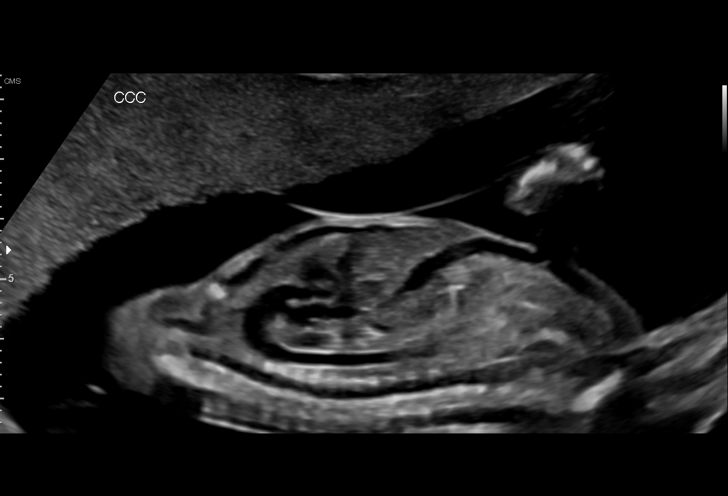
[im 45/53]
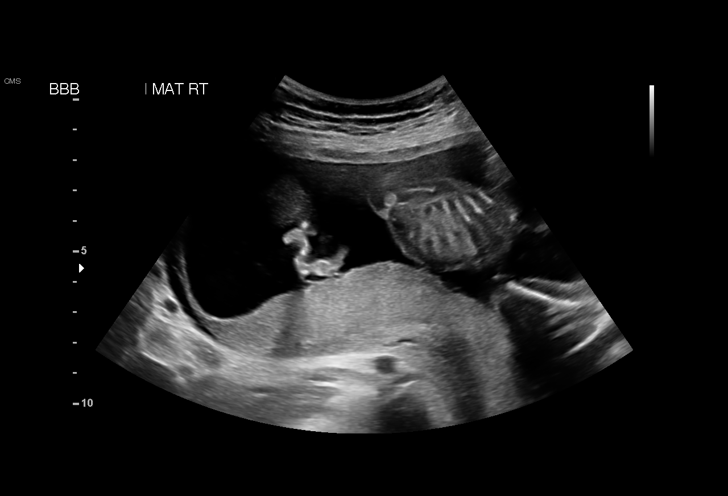
[im 49/53]
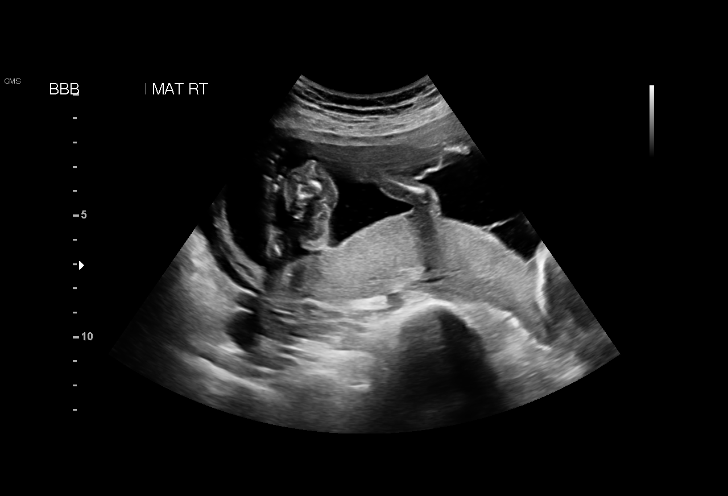
[im 53/53]
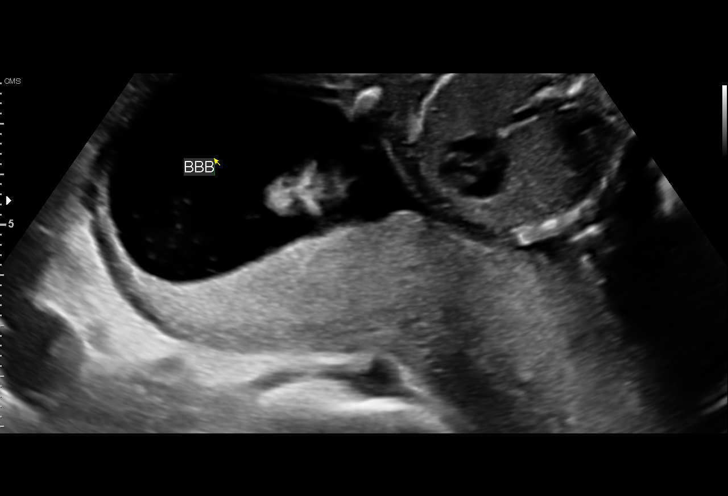

[14 of 28 positions shown; findings below may reference images not displayed]

Centre
[address],

1  [address]              [id number]      [id number]     [phone]
Indications

18 weeks gestation of pregnancy
Triplet gestation, with two or more
monochorionic fetuses
Advanced maternal age multigravida (35)
second trimester: MALAYKO/HERDIAWAN MAMIN; declined testing
Family history of genetic disorder (previous
children with Pendred Syndrome)
OB History

Blood Type:            Height:         Weight (lb):  133       BMI:
Gravidity:    3         Term:   2        Prem:   0         SAB:   0
TOP:          0       Ectopic:  0        Living: 2
Fetal Evaluation (Fetus A)

Num Of Fetuses:     3
Fetal Heart         157
Rate(bpm):
Cardiac Activity:   Observed
Fetal Lie:          Lower Left Fetus
Presentation:       Cephalic
Placenta:           Anterior, above cervical os
P. Cord Insertion:  Visualized, central
Membrane Desc:      Dividing Membrane seen

Amniotic Fluid
AFI FV:      Subjectively within normal limits

Largest Pocket(cm)
3.66
Gestational Age (Fetus A)

LMP:           18w 1d        Date:  04/19/16                 EDD:    01/24/17
Best:          18w 3d     Det. By:  Early Ultrasound         EDD:    01/22/17
(06/05/16)
Anatomy (Fetus A)

Nuchal Fold:           Appears normal         LVOT:                   Appears normal
Face:                  Appears normal
(orbits and profile)

Fetal Evaluation (Fetus B)

Num Of Fetuses:     3
Fetal Heart         157
Rate(bpm):
Cardiac Activity:   Observed
Fetal Lie:          Maternal right side
Presentation:       Breech
Placenta:           Posterior, above cervical os
P. Cord Insertion:  Visualized, central
Membrane Desc:      Dividing Membrane seen

Amniotic Fluid
AFI FV:      Subjectively within normal limits

Largest Pocket(cm)
4.19
Gestational Age (Fetus B)

LMP:           18w 1d        Date:  04/19/16                 EDD:    01/24/17
Best:          18w 3d     Det. By:  Early Ultrasound         EDD:    01/22/17
(06/05/16)
Anatomy (Fetus B)

Lips:                  Appears normal

Fetal Evaluation (Fetus C)

Num Of Fetuses:     3
Fetal Heart         157
Rate(bpm):
Cardiac Activity:   Observed
Fetal Lie:          Upper Left fetus
Presentation:       Breech
Placenta:           Anterior, above cervical os
P. Cord Insertion:  Marginal insertion (lateral)
Membrane Desc:      Dividing Membrane seen

Amniotic Fluid
AFI FV:      Subjectively within normal limits

Largest Pocket(cm)
3.71
Comment:    Dichorionic, triamniotic triplet gestation
Gestational Age (Fetus C)

LMP:           18w 1d        Date:  04/19/16                 EDD:    01/24/17
Best:          18w 3d     Det. By:  Early Ultrasound         EDD:    01/22/17
(06/05/16)
Anatomy (Fetus C)

Nuchal Fold:           Appears normal         Aortic Arch:            Appears normal
Face:                  Orbits nl; profile     Ductal Arch:            Appears normal
prev visualized
Lips:                  Appears normal         Abdominal Wall:         Appears nml (cord
insert, abd wall)
Heart:                 Appears normal
(4CH, axis, and situs
Cervix Uterus Adnexa

Cervix
Length:              4  cm.
Normal appearance by transabdominal scan.

Uterus
No abnormality visualized.

Left Ovary
Not visualized.

Right Ovary
Not visualized.

Cul De Sac:   No free fluid seen.

Adnexa:       No abnormality visualized.
Impression

Dichorionic/triamniotic triplet pregnancy at 18+3 weeks
Normal amniotic fluid volume x 3
No evidence of TTTS
Recommendations

Growth US in 2 weeks
TTTS check in 4 weeks

## 2018-09-14 DIAGNOSIS — I82890 Acute embolism and thrombosis of other specified veins: Secondary | ICD-10-CM | POA: Diagnosis not present

## 2018-09-16 DIAGNOSIS — D649 Anemia, unspecified: Secondary | ICD-10-CM | POA: Diagnosis not present

## 2018-09-16 DIAGNOSIS — Z6821 Body mass index (BMI) 21.0-21.9, adult: Secondary | ICD-10-CM | POA: Diagnosis not present

## 2018-09-16 DIAGNOSIS — I82890 Acute embolism and thrombosis of other specified veins: Secondary | ICD-10-CM | POA: Diagnosis not present

## 2018-09-16 DIAGNOSIS — Z803 Family history of malignant neoplasm of breast: Secondary | ICD-10-CM | POA: Diagnosis not present

## 2018-09-22 DIAGNOSIS — R5383 Other fatigue: Secondary | ICD-10-CM | POA: Diagnosis not present

## 2018-09-30 DIAGNOSIS — D500 Iron deficiency anemia secondary to blood loss (chronic): Secondary | ICD-10-CM | POA: Diagnosis not present

## 2018-10-07 DIAGNOSIS — D649 Anemia, unspecified: Secondary | ICD-10-CM | POA: Diagnosis not present

## 2018-10-07 DIAGNOSIS — D500 Iron deficiency anemia secondary to blood loss (chronic): Secondary | ICD-10-CM | POA: Diagnosis not present

## 2018-10-07 IMAGING — US US MFM OB LIMITED
1 series · 14 of 28 positions shown · non-contrast
Comparison: none

[Series 1: us mfm ob limited · 46 acquisitions, 14 frames shown]
[im 2/46]
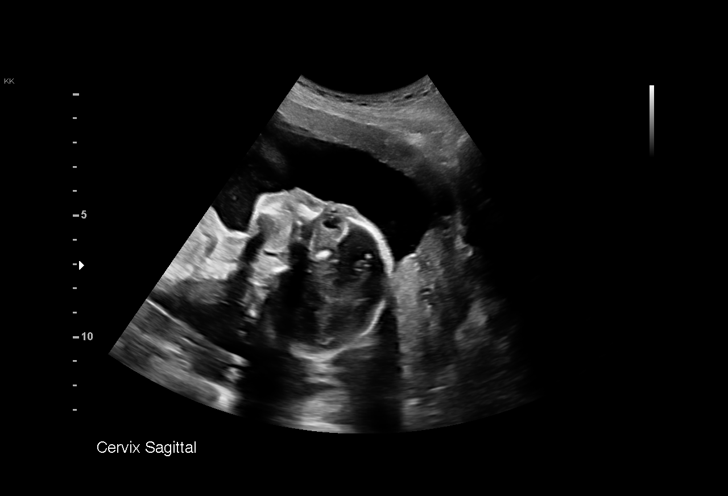
[im 6/46]
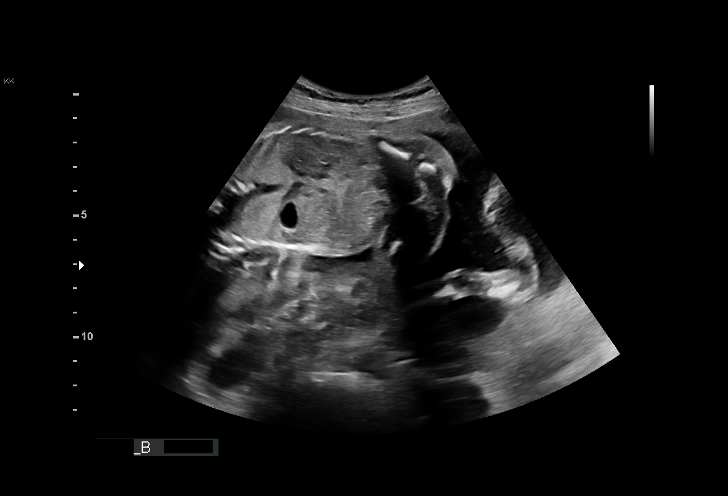
[im 9/46]
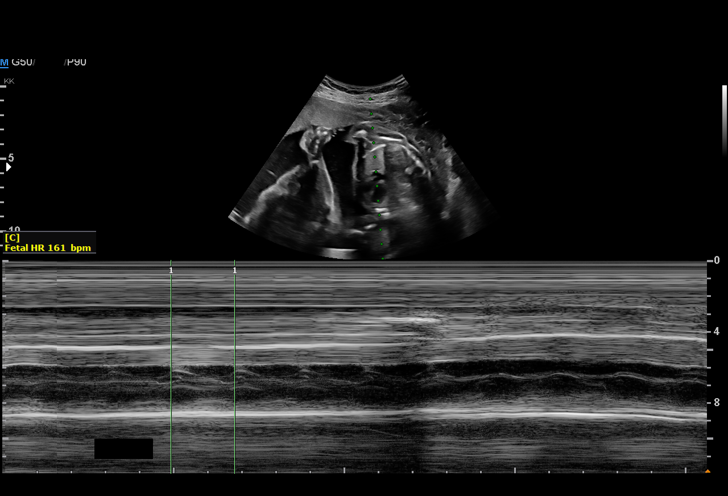
[im 12/46]
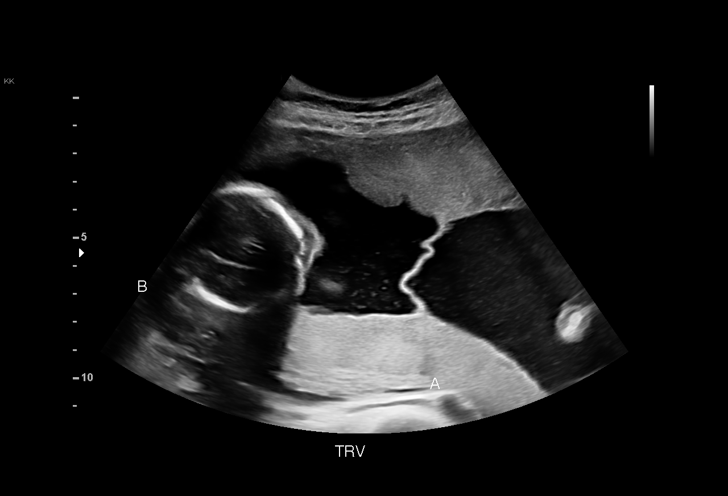
[im 16/46]
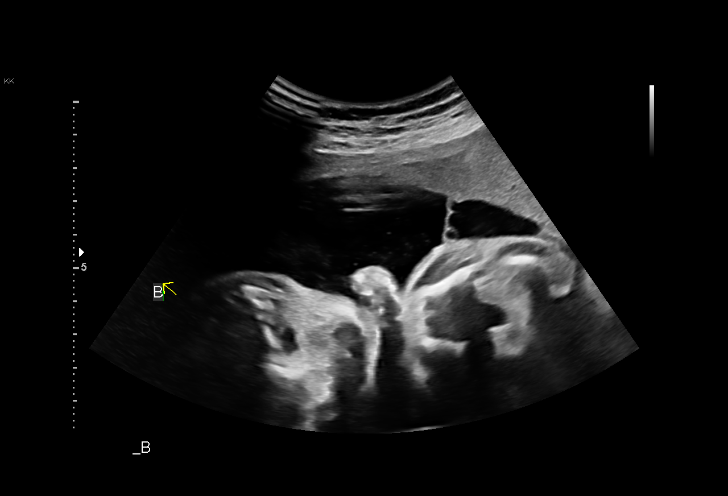
[im 19/46]
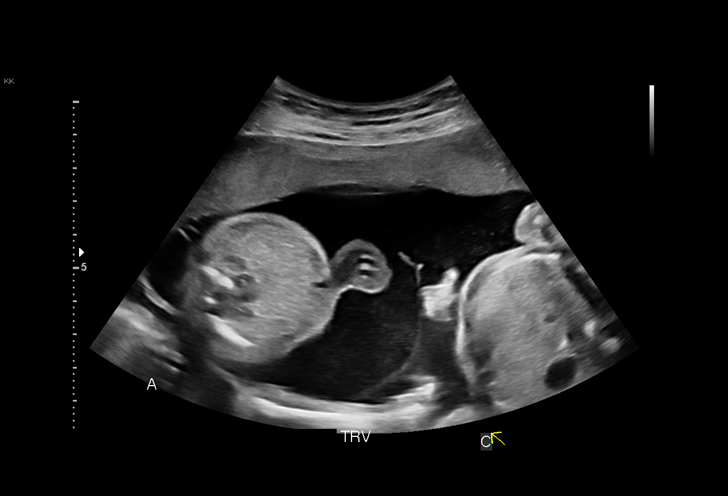
[im 22/46]
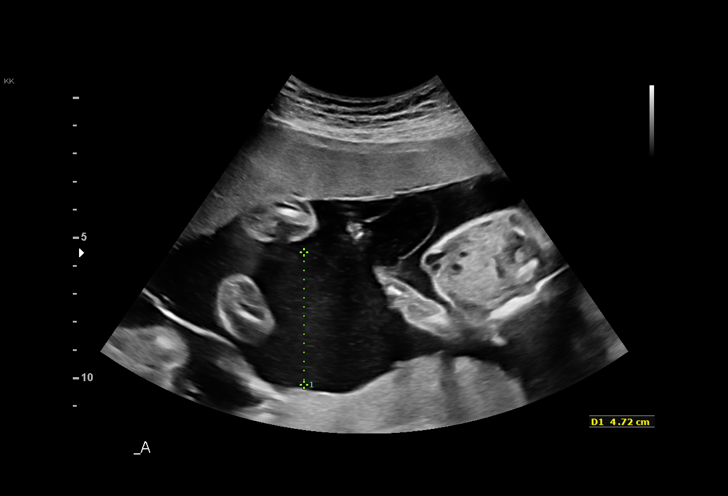
[im 26/46]
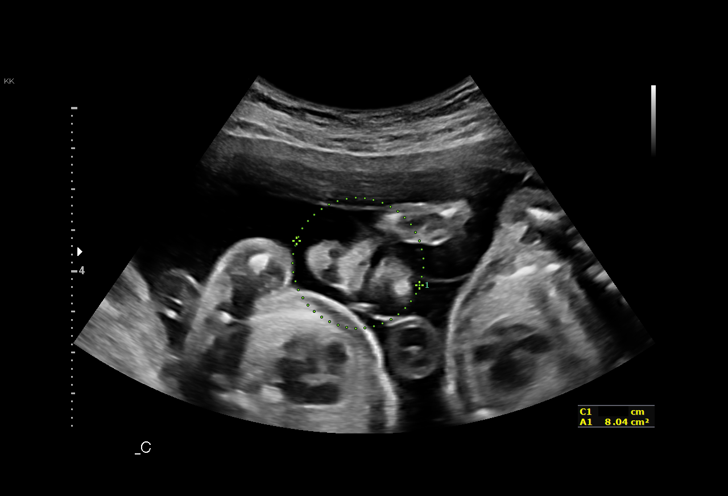
[im 29/46]
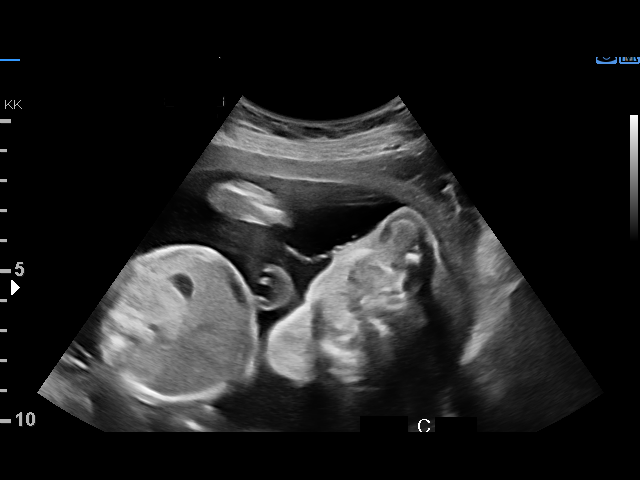
[im 32/46]
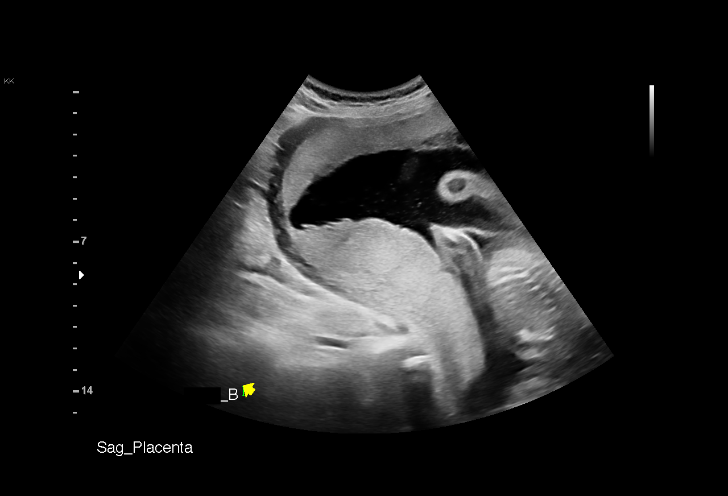
[im 36/46]
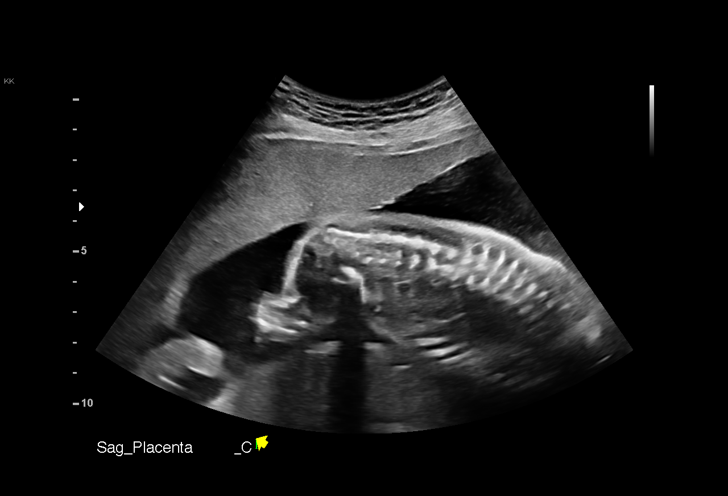
[im 39/46]
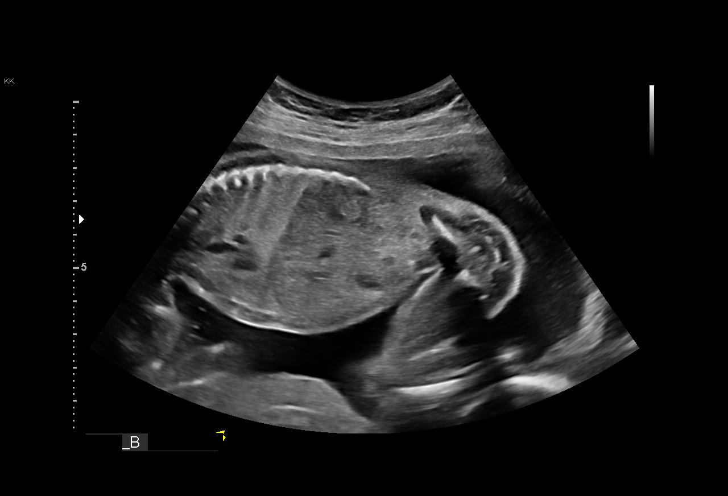
[im 42/46]
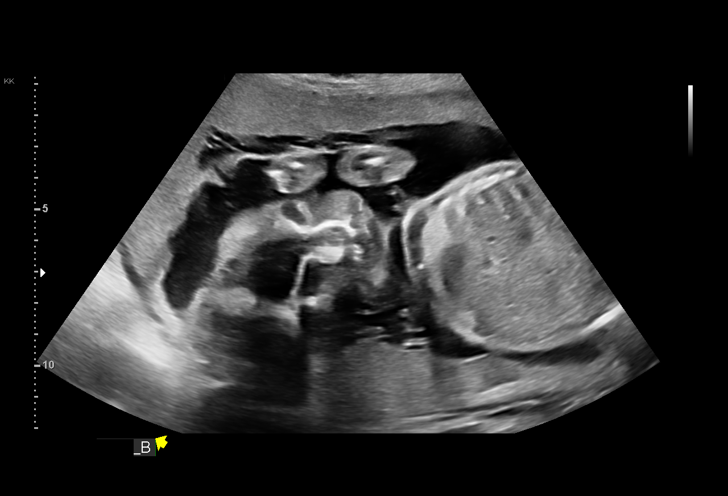
[im 46/46]
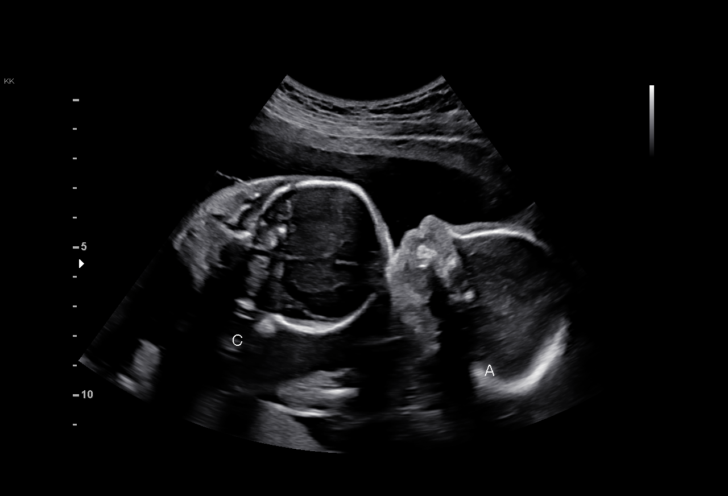

[14 of 28 positions shown; findings below may reference images not displayed]

Centre
[address],

1  AZIGGI ALIMAS            [id number]      [phone]     [phone]
Indications

22 weeks gestation of pregnancy
Triplet gestation, with two or more
monochorionic fetuses
Advanced maternal age multigravida (35)
second trimester: MARY Y EDU/MOON SUK RANSOM; declined testing
Family history of genetic disorder (previous
children with Pendred Syndrome)
OB History

Blood Type:            Height:         Weight (lb):  133      BMI:
Gravidity:    3         Term:   2        Prem:   0        SAB:   0
TOP:          0       Ectopic:  0        Living: 2
Fetal Evaluation (Fetus A)

Num Of Fetuses:     3
Fetal Heart         154
Rate(bpm):
Cardiac Activity:   Observed
Fetal Lie:          Lower Left Fetus
Presentation:       Cephalic
Placenta:           Anterior, above cervical os
P. Cord Insertion:  Previously Visualized
Membrane Desc:      Dividing Membrane seen

Amniotic Fluid
AFI FV:      Subjectively within normal limits

Largest Pocket(cm)
4.7
Gestational Age (Fetus A)

LMP:           21w 5d       Date:   04/19/16                 EDD:   01/24/17
Best:          22w 0d    Det. By:   Early Ultrasound         EDD:   01/22/17
(06/05/16)

Fetal Evaluation (Fetus B)

Num Of Fetuses:     3
Fetal Heart         150
Rate(bpm):
Cardiac Activity:   Observed
Fetal Lie:          Maternal right side
Presentation:       Breech
Placenta:           Posterior, above cervical os
P. Cord Insertion:  Previously Visualized
Membrane Desc:      Dividing Membrane seen

Amniotic Fluid
AFI FV:      Subjectively within normal limits

Largest Pocket(cm)
3.2
Gestational Age (Fetus B)

LMP:           21w 5d       Date:   04/19/16                 EDD:   01/24/17
Best:          22w 0d    Det. By:   Early Ultrasound         EDD:   01/22/17
(06/05/16)

Fetal Evaluation (Fetus C)

Num Of Fetuses:     3
Fetal Heart         161
Rate(bpm):
Cardiac Activity:   Observed
Fetal Lie:          Upper Left Fetus
Presentation:       Cephalic
Placenta:           Anterior, above cervical os
P. Cord Insertion:  Marginal insertion
Membrane Desc:      Dividing Membrane seen

Amniotic Fluid
AFI FV:      Subjectively within normal limits

Largest Pocket(cm)
4.5
Gestational Age (Fetus C)

LMP:           21w 5d       Date:   04/19/16                 EDD:   01/24/17
Best:          22w 0d    Det. By:   Early Ultrasound         EDD:   01/22/17
(06/05/16)
Cervix Uterus Adnexa
Cervix
Length:            4.6  cm.
Normal appearance by transabdominal scan.
Impression

Triamniotic Dichorionic triplet intrauterine pregnancy at 22+0
weeks on limited follow up:

active triplet gestation x 3
Presentation is cephalic/breech/cephalic
Placentation is monochorionic between A & C, noting no
evidence of TTTS in this pair on today's evaluation
Normal amniotic fluid in each sac, noting concordant fluid
Review of anatomy shows no evidence of structural
anomalies or sonographic markers for aneuploidy for all of
the triplets
cervix appears long and closed by transabdominal scan
no previa
Recommendations

Continue evaluation for growth every 4 weeks and for twin-to-
twin transfusion syndrome every 2 weeks (ie, interval growth
was scheduled for 2 weeks from today).

## 2018-10-13 DIAGNOSIS — Z9851 Tubal ligation status: Secondary | ICD-10-CM | POA: Diagnosis not present

## 2018-10-13 DIAGNOSIS — Z6821 Body mass index (BMI) 21.0-21.9, adult: Secondary | ICD-10-CM | POA: Diagnosis not present

## 2018-10-13 DIAGNOSIS — Z8249 Family history of ischemic heart disease and other diseases of the circulatory system: Secondary | ICD-10-CM | POA: Diagnosis not present

## 2018-10-13 DIAGNOSIS — Z811 Family history of alcohol abuse and dependence: Secondary | ICD-10-CM | POA: Diagnosis not present

## 2018-10-13 DIAGNOSIS — Z7901 Long term (current) use of anticoagulants: Secondary | ICD-10-CM | POA: Diagnosis not present

## 2018-10-13 DIAGNOSIS — Z30432 Encounter for removal of intrauterine contraceptive device: Secondary | ICD-10-CM | POA: Diagnosis not present

## 2018-10-13 DIAGNOSIS — I82890 Acute embolism and thrombosis of other specified veins: Secondary | ICD-10-CM | POA: Diagnosis not present

## 2018-10-13 DIAGNOSIS — Z79899 Other long term (current) drug therapy: Secondary | ICD-10-CM | POA: Diagnosis not present

## 2018-10-13 DIAGNOSIS — K219 Gastro-esophageal reflux disease without esophagitis: Secondary | ICD-10-CM | POA: Diagnosis not present

## 2018-10-13 DIAGNOSIS — Z833 Family history of diabetes mellitus: Secondary | ICD-10-CM | POA: Diagnosis not present

## 2018-10-13 DIAGNOSIS — N938 Other specified abnormal uterine and vaginal bleeding: Secondary | ICD-10-CM | POA: Diagnosis not present

## 2018-10-13 DIAGNOSIS — T8332XA Displacement of intrauterine contraceptive device, initial encounter: Secondary | ICD-10-CM | POA: Diagnosis not present

## 2018-10-13 DIAGNOSIS — Z806 Family history of leukemia: Secondary | ICD-10-CM | POA: Diagnosis not present

## 2018-10-13 DIAGNOSIS — Z886 Allergy status to analgesic agent status: Secondary | ICD-10-CM | POA: Diagnosis not present

## 2018-10-13 DIAGNOSIS — N921 Excessive and frequent menstruation with irregular cycle: Secondary | ICD-10-CM | POA: Diagnosis not present

## 2018-10-13 DIAGNOSIS — D649 Anemia, unspecified: Secondary | ICD-10-CM | POA: Diagnosis not present

## 2018-10-13 DIAGNOSIS — Z803 Family history of malignant neoplasm of breast: Secondary | ICD-10-CM | POA: Diagnosis not present

## 2018-10-14 DIAGNOSIS — Z79899 Other long term (current) drug therapy: Secondary | ICD-10-CM | POA: Diagnosis not present

## 2018-10-14 DIAGNOSIS — Z7901 Long term (current) use of anticoagulants: Secondary | ICD-10-CM | POA: Diagnosis not present

## 2018-10-14 DIAGNOSIS — N858 Other specified noninflammatory disorders of uterus: Secondary | ICD-10-CM | POA: Diagnosis not present

## 2018-10-14 DIAGNOSIS — T8332XA Displacement of intrauterine contraceptive device, initial encounter: Secondary | ICD-10-CM | POA: Diagnosis not present

## 2018-10-14 DIAGNOSIS — Z30432 Encounter for removal of intrauterine contraceptive device: Secondary | ICD-10-CM | POA: Diagnosis not present

## 2018-10-14 DIAGNOSIS — Z4689 Encounter for fitting and adjustment of other specified devices: Secondary | ICD-10-CM | POA: Diagnosis not present

## 2018-10-14 DIAGNOSIS — Z886 Allergy status to analgesic agent status: Secondary | ICD-10-CM | POA: Diagnosis not present

## 2018-10-14 DIAGNOSIS — K219 Gastro-esophageal reflux disease without esophagitis: Secondary | ICD-10-CM | POA: Diagnosis not present

## 2018-10-14 DIAGNOSIS — N938 Other specified abnormal uterine and vaginal bleeding: Secondary | ICD-10-CM | POA: Diagnosis not present

## 2018-10-14 DIAGNOSIS — Z833 Family history of diabetes mellitus: Secondary | ICD-10-CM | POA: Diagnosis not present

## 2018-10-14 DIAGNOSIS — Z806 Family history of leukemia: Secondary | ICD-10-CM | POA: Diagnosis not present

## 2018-10-14 DIAGNOSIS — T8386XA Thrombosis of genitourinary prosthetic devices, implants and grafts, initial encounter: Secondary | ICD-10-CM | POA: Diagnosis not present

## 2018-10-14 DIAGNOSIS — Z811 Family history of alcohol abuse and dependence: Secondary | ICD-10-CM | POA: Diagnosis not present

## 2018-10-14 DIAGNOSIS — N921 Excessive and frequent menstruation with irregular cycle: Secondary | ICD-10-CM | POA: Diagnosis not present

## 2018-10-14 DIAGNOSIS — Z9851 Tubal ligation status: Secondary | ICD-10-CM | POA: Diagnosis not present

## 2018-10-14 DIAGNOSIS — I82890 Acute embolism and thrombosis of other specified veins: Secondary | ICD-10-CM | POA: Diagnosis not present

## 2018-10-14 DIAGNOSIS — Z803 Family history of malignant neoplasm of breast: Secondary | ICD-10-CM | POA: Diagnosis not present

## 2018-10-14 DIAGNOSIS — Z8249 Family history of ischemic heart disease and other diseases of the circulatory system: Secondary | ICD-10-CM | POA: Diagnosis not present

## 2018-10-14 DIAGNOSIS — D649 Anemia, unspecified: Secondary | ICD-10-CM | POA: Diagnosis not present

## 2018-10-18 DIAGNOSIS — J019 Acute sinusitis, unspecified: Secondary | ICD-10-CM | POA: Diagnosis not present

## 2018-10-18 DIAGNOSIS — Z6820 Body mass index (BMI) 20.0-20.9, adult: Secondary | ICD-10-CM | POA: Diagnosis not present

## 2018-10-21 IMAGING — US US MFM OB FOLLOW-UP EACH ADDL GEST (MODIFY)
1 series · 15 of 28 positions shown · non-contrast
Comparison: none

[Series 1: us mfm ob follow-up each addl gest (modify) · 95 acquisitions, 15 frames shown]
[im 1/95]
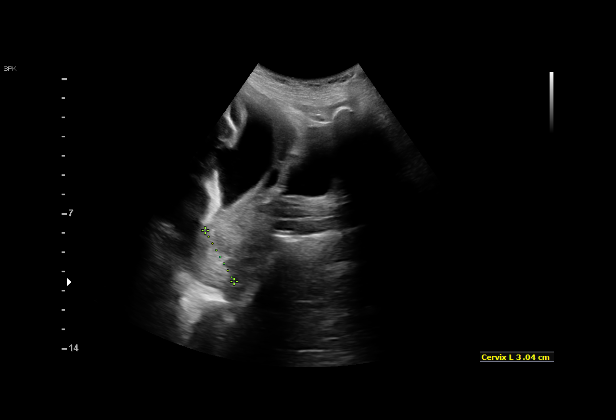
[im 7/95]
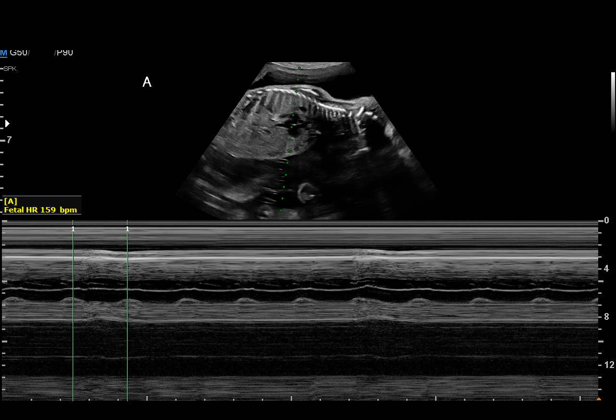
[im 14/95]
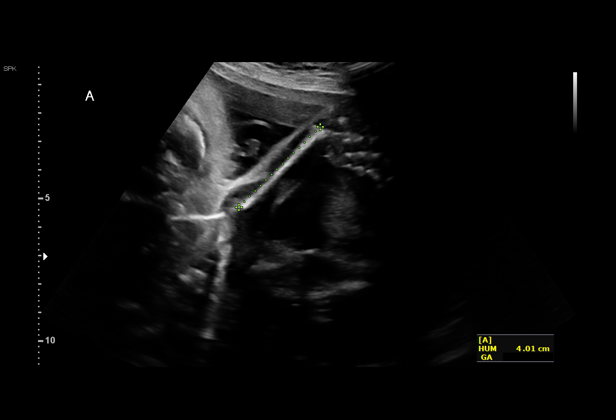
[im 21/95]
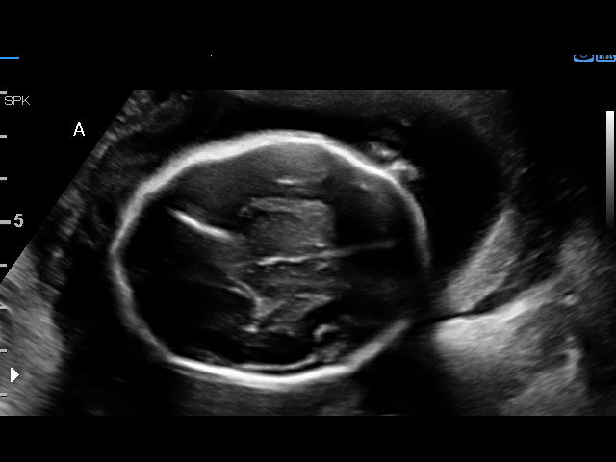
[im 28/95]
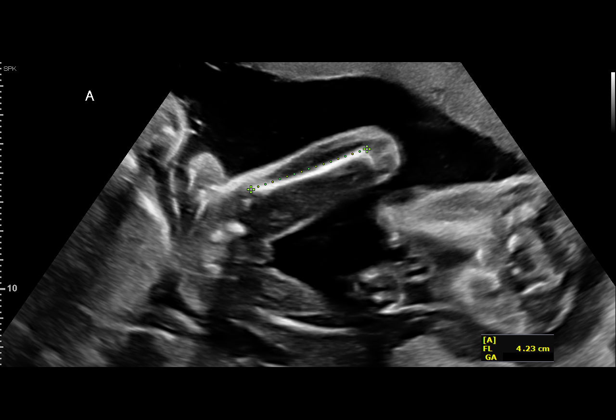
[im 35/95]
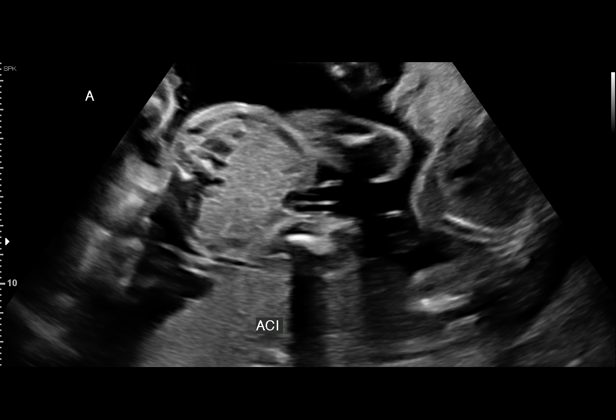
[im 42/95]
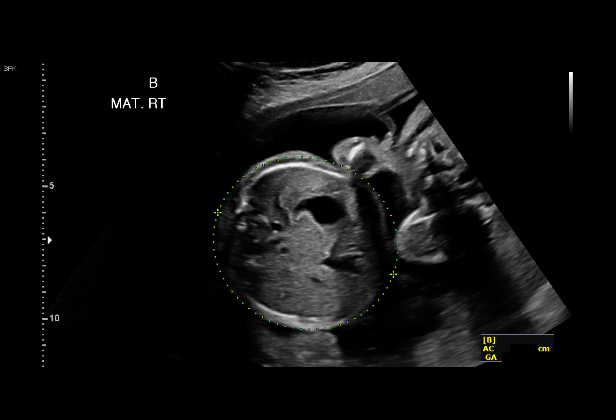
[im 49/95]
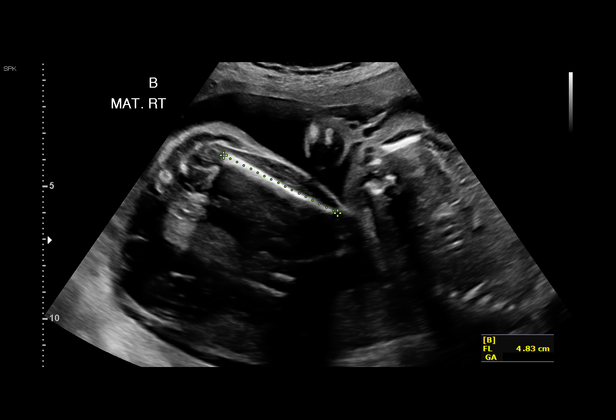
[im 53/95]
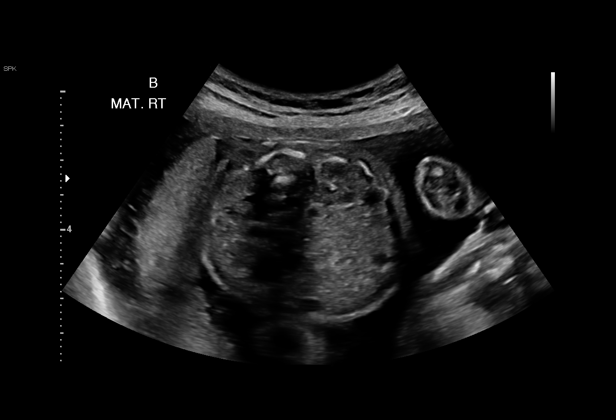
[im 60/95]
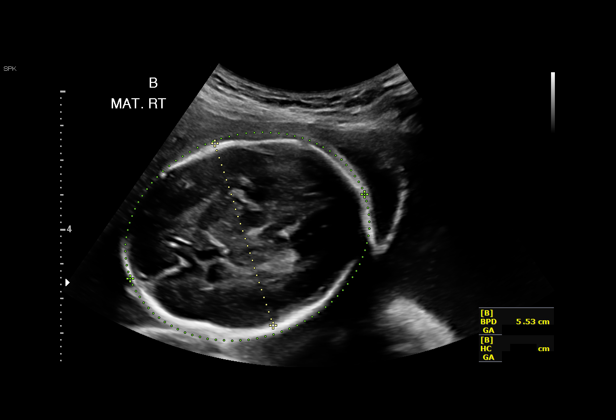
[im 67/95]
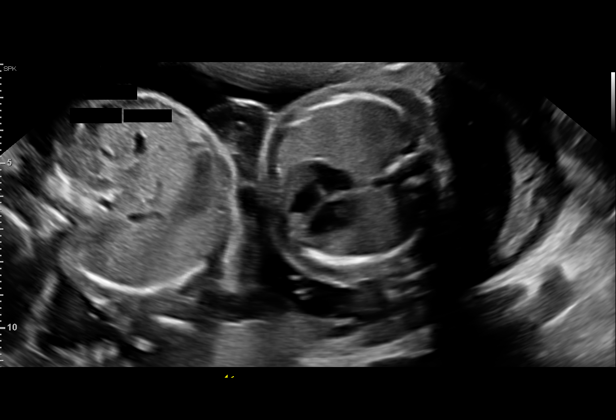
[im 74/95]
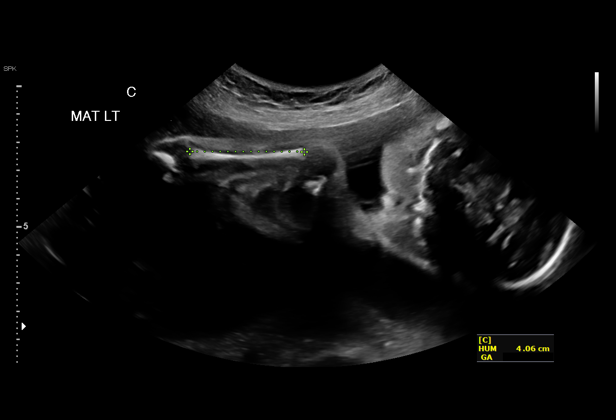
[im 81/95]
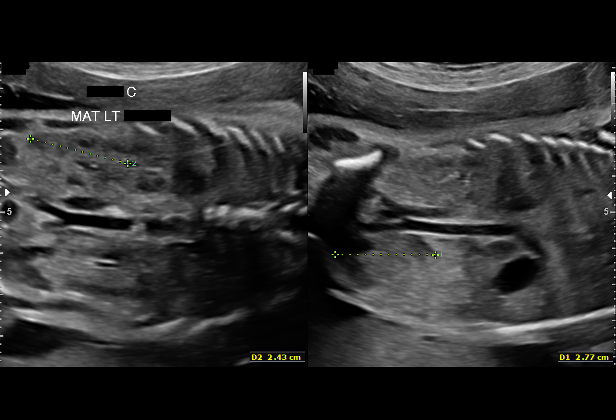
[im 88/95]
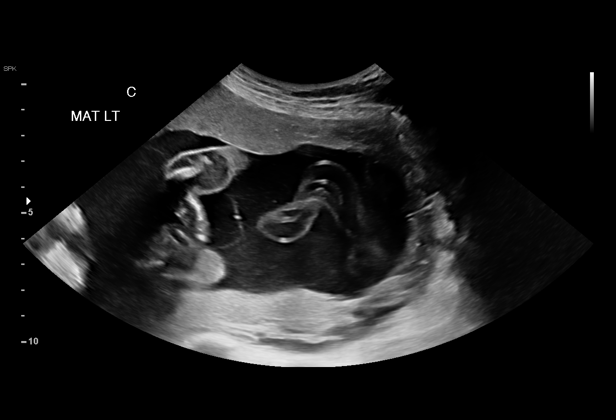
[im 95/95]
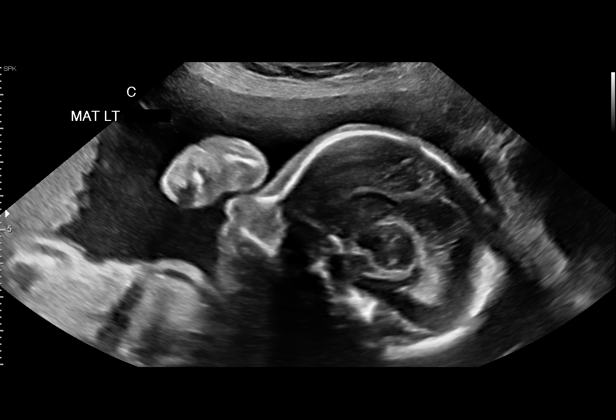

[15 of 28 positions shown; findings below may reference images not displayed]

Centre
[address],

1  JALMAR FACKYEA            [id number]      [id number]     [id number]
2  JALMAR FACKYEA            [id number]      [id number]     [id number]
3  JALMAR FACKYEA            [id number]      [id number]     [id number]
Indications

24 weeks gestation of pregnancy
Triplet gestation, with two or more
monochorionic fetuses
Advanced maternal age multigravida (35)
second trimester: MAKELLELE/COSMIN VOSS; declined testing
Family history of genetic disorder (previous
children with Pendred Syndrome)
OB History

Blood Type:            Height:         Weight (lb):  133      BMI:
Gravidity:    3         Term:   2        Prem:   0        SAB:   0
TOP:          0       Ectopic:  0        Living: 2
Fetal Evaluation (Fetus A)

Num Of Fetuses:     3
Fetal Heart         159
Rate(bpm):
Cardiac Activity:   Observed
Fetal Lie:          Lower Fetus midline
Presentation:       Cephalic
Placenta:           Anterior, above cervical os

Amniotic Fluid
AFI FV:      Subjectively within normal limits

Largest Pocket(cm)
5.02
Biometry (Fetus A)

BPD:      55.4  mm     G. Age:  22w 6d         10  %    CI:        70.82   %   70 - 86
FL/HC:      20.3   %   18.7 -
HC:      209.8  mm     G. Age:  23w 1d          8  %    HC/AC:      1.03       1.05 -
AC:      203.5  mm     G. Age:  25w 0d         71  %    FL/BPD:     76.7   %   71 - 87
FL:       42.5  mm     G. Age:  23w 6d         34  %    FL/AC:      20.9   %   20 - 24
HUM:      39.6  mm     G. Age:  24w 1d         46  %

Est. FW:     678  gm      1 lb 8 oz     57  %     FW Discordancy        17  %
Gestational Age (Fetus A)

LMP:           23w 5d       Date:   04/19/16                 EDD:   01/24/17
U/S Today:     23w 5d                                        EDD:   01/24/17
Best:          24w 0d    Det. By:   Early Ultrasound         EDD:   01/22/17
(06/05/16)
Anatomy (Fetus A)

Cranium:               Appears normal         Aortic Arch:            Previously seen
Cavum:                 Appears normal         Ductal Arch:            Previously seen
Ventricles:            Appears normal         Diaphragm:              Appears normal
Choroid Plexus:        Previously seen        Stomach:                Appears normal, left
sided
Cerebellum:            Previously seen        Abdomen:                Appears normal
Posterior Fossa:       Previously seen        Abdominal Wall:         Previously seen
Nuchal Fold:           Not applicable (>20    Cord Vessels:           Appears normal (3
wks GA)                                        vessel cord)
Face:                  Appears normal         Kidneys:                Appear normal
(orbits and profile)
Lips:                  Previously seen        Bladder:                Appears normal
Thoracic:              Previously seen        Spine:                  Previously seen
Appears normal
Heart:                 Appears normal         Upper Extremities:      Previously seen
(4CH, axis, and situs
RVOT:                  Previously seen        Lower Extremities:      Previously seen
LVOT:                  Previously seen

Other:  Female gender previously seen.

Fetal Evaluation (Fetus B)

Num Of Fetuses:     3
Fetal Heart         149
Rate(bpm):
Cardiac Activity:   Observed
Fetal Lie:          Maternal right side
Presentation:       Cephalic
Placenta:           Posterior, above cervical os

Amniotic Fluid
AFI FV:      Subjectively within normal limits

Largest Pocket(cm)
4.68
Biometry (Fetus B)
BPD:      55.3  mm     G. Age:  22w 6d         10  %    CI:        73.03   %   70 - 86
FL/HC:      23.4   %   18.7 -
HC:      205.7  mm     G. Age:  22w 5d          3  %    HC/AC:      0.96       1.05 -
AC:       214   mm     G. Age:  25w 6d         91  %    FL/BPD:     87.0   %   71 - 87
FL:       48.1  mm     G. Age:  26w 1d         92  %    FL/AC:      22.5   %   20 - 24
HUM:      44.2  mm     G. Age:  26w 2d       > 95  %
Est. FW:     814  gm    1 lb 13 oz      77  %     FW Discordancy     0 \ 17 %
Gestational Age (Fetus B)

LMP:           23w 5d       Date:   04/19/16                 EDD:   01/24/17
U/S Today:     24w 3d                                        EDD:   01/19/17
Best:          24w 0d    Det. By:   Early Ultrasound         EDD:   01/22/17
(06/05/16)
Anatomy (Fetus B)

Cranium:               Appears normal         Aortic Arch:            Previously seen
Cavum:                 Appears normal         Ductal Arch:            Previously seen
Ventricles:            Appears normal         Diaphragm:              Appears normal
Choroid Plexus:        Previously seen        Stomach:                Appears normal, left
sided
Cerebellum:            Previously seen        Abdomen:                Appears normal
Posterior Fossa:       Previously seen        Abdominal Wall:         Previously seen
Nuchal Fold:           Not applicable (>20    Cord Vessels:           Appears normal (3
wks GA)                                        vessel cord)
Face:                  Orbits and profile     Kidneys:                Appear normal
previously seen
Lips:                  Previously seen        Bladder:                Appears normal
Thoracic:              Previously seen        Spine:                  Previously seen
Appears normal
Heart:                 Appears normal         Upper Extremities:      Previously seen
(4CH, axis, and situs
RVOT:                  Previously seen        Lower Extremities:      Previously seen
LVOT:                  Previously seen

Other:  Female gender previously seen.

Fetal Evaluation (Fetus C)

Num Of Fetuses:     3
Fetal Heart         136
Rate(bpm):
Cardiac Activity:   Observed
Fetal Lie:          Upper Fetus left
Presentation:       Cephalic
Placenta:           Anterior, above cervical os

Amniotic Fluid
AFI FV:      Subjectively within normal limits

Largest Pocket(cm)
5.84
Biometry (Fetus C)

BPD:      56.8  mm     G. Age:  23w 2d         22  %    CI:        73.99   %   70 - 86
FL/HC:      20.8   %   18.7 -
HC:      209.7  mm     G. Age:  23w 0d          8  %    HC/AC:      1.03       1.05 -
AC:      203.9  mm     G. Age:  25w 0d         72  %    FL/BPD:     76.9   %   71 - 87
FL:       43.7  mm     G. Age:  24w 3d         49  %    FL/AC:      21.4   %   20 - 24
HUM:      40.5  mm     G. Age:  24w 4d         56  %

Est. FW:     701  gm      1 lb 9 oz     61  %     FW Discordancy        14  %
Gestational Age (Fetus C)

LMP:           23w 5d       Date:   04/19/16                 EDD:   01/24/17
U/S Today:     24w 0d                                        EDD:   01/22/17
Best:          24w 0d    Det. By:   Early Ultrasound         EDD:   01/22/17
(06/05/16)
Anatomy (Fetus C)

Cranium:               Appears normal         Aortic Arch:            Previously seen
Cavum:                 Appears normal         Ductal Arch:            Previously seen
Ventricles:            Appears normal         Diaphragm:              Appears normal
Choroid Plexus:        Previously seen        Stomach:                Appears normal, left
sided
Cerebellum:            Previously seen        Abdomen:                Appears normal
Posterior Fossa:       Previously seen        Abdominal Wall:         Previously seen
Nuchal Fold:           Not applicable (>20    Cord Vessels:           Appears normal (3
wks GA)                                        vessel cord)
Face:                  Orbits and profile     Kidneys:                Appear normal
previously seen
Lips:                  Previously seen        Bladder:                Appears normal
Thoracic:              Appears normal         Spine:                  Previously seen
Heart:                 Appears normal         Upper Extremities:      Previously seen
(4CH, axis, and situs
RVOT:                  Previously seen        Lower Extremities:      Previously seen
LVOT:                  Previously seen

Other:  Female gender previously seen.
Cervix Uterus Adnexa

Cervix
Length:           3.19  cm.
Normal appearance by transabdominal scan.
Impression

Dichorionic/triamniotic twin pregnancy at 24+0 weeks
Normal interval anatomy x 3; anatomic survey complete x 3
Normal amniotic fluid volume x 3
Appropriate interval growths with EFWs at the 57th, 77th and
61st %tiles
No evidence of TTTS
Recommendations

Fluid check in 2 weeks
Fluid check and growth in 4 weeks

## 2018-10-25 DIAGNOSIS — D508 Other iron deficiency anemias: Secondary | ICD-10-CM | POA: Diagnosis not present

## 2018-10-25 DIAGNOSIS — D649 Anemia, unspecified: Secondary | ICD-10-CM | POA: Diagnosis not present

## 2018-10-25 DIAGNOSIS — I82890 Acute embolism and thrombosis of other specified veins: Secondary | ICD-10-CM | POA: Diagnosis not present

## 2018-10-25 DIAGNOSIS — N924 Excessive bleeding in the premenopausal period: Secondary | ICD-10-CM | POA: Diagnosis not present

## 2018-10-25 DIAGNOSIS — Z6821 Body mass index (BMI) 21.0-21.9, adult: Secondary | ICD-10-CM | POA: Diagnosis not present

## 2018-10-26 DIAGNOSIS — N938 Other specified abnormal uterine and vaginal bleeding: Secondary | ICD-10-CM | POA: Diagnosis not present

## 2018-10-26 DIAGNOSIS — Z6821 Body mass index (BMI) 21.0-21.9, adult: Secondary | ICD-10-CM | POA: Diagnosis not present

## 2018-10-26 DIAGNOSIS — I82890 Acute embolism and thrombosis of other specified veins: Secondary | ICD-10-CM | POA: Diagnosis not present

## 2018-10-26 DIAGNOSIS — Z09 Encounter for follow-up examination after completed treatment for conditions other than malignant neoplasm: Secondary | ICD-10-CM | POA: Diagnosis not present

## 2018-11-21 DIAGNOSIS — N938 Other specified abnormal uterine and vaginal bleeding: Secondary | ICD-10-CM | POA: Diagnosis not present

## 2018-11-28 DIAGNOSIS — J020 Streptococcal pharyngitis: Secondary | ICD-10-CM | POA: Diagnosis not present

## 2018-11-28 DIAGNOSIS — Z6820 Body mass index (BMI) 20.0-20.9, adult: Secondary | ICD-10-CM | POA: Diagnosis not present

## 2018-12-23 DIAGNOSIS — D508 Other iron deficiency anemias: Secondary | ICD-10-CM | POA: Diagnosis not present

## 2019-02-09 DIAGNOSIS — Z6820 Body mass index (BMI) 20.0-20.9, adult: Secondary | ICD-10-CM | POA: Diagnosis not present

## 2019-02-09 DIAGNOSIS — R300 Dysuria: Secondary | ICD-10-CM | POA: Diagnosis not present

## 2019-02-21 DIAGNOSIS — D508 Other iron deficiency anemias: Secondary | ICD-10-CM | POA: Diagnosis not present

## 2019-02-24 DIAGNOSIS — D508 Other iron deficiency anemias: Secondary | ICD-10-CM | POA: Diagnosis not present

## 2019-02-24 DIAGNOSIS — Z8279 Family history of other congenital malformations, deformations and chromosomal abnormalities: Secondary | ICD-10-CM | POA: Diagnosis not present

## 2019-02-24 DIAGNOSIS — I82890 Acute embolism and thrombosis of other specified veins: Secondary | ICD-10-CM | POA: Diagnosis not present

## 2019-02-24 DIAGNOSIS — N924 Excessive bleeding in the premenopausal period: Secondary | ICD-10-CM | POA: Diagnosis not present

## 2019-02-24 DIAGNOSIS — Z6820 Body mass index (BMI) 20.0-20.9, adult: Secondary | ICD-10-CM | POA: Diagnosis not present

## 2019-05-29 DIAGNOSIS — R300 Dysuria: Secondary | ICD-10-CM | POA: Diagnosis not present

## 2019-05-29 DIAGNOSIS — Z6820 Body mass index (BMI) 20.0-20.9, adult: Secondary | ICD-10-CM | POA: Diagnosis not present

## 2019-05-29 DIAGNOSIS — R350 Frequency of micturition: Secondary | ICD-10-CM | POA: Diagnosis not present

## 2019-08-01 DIAGNOSIS — M7552 Bursitis of left shoulder: Secondary | ICD-10-CM | POA: Diagnosis not present

## 2019-12-05 DIAGNOSIS — Z0000 Encounter for general adult medical examination without abnormal findings: Secondary | ICD-10-CM | POA: Diagnosis not present

## 2019-12-05 DIAGNOSIS — E7800 Pure hypercholesterolemia, unspecified: Secondary | ICD-10-CM | POA: Diagnosis not present

## 2019-12-07 DIAGNOSIS — Z681 Body mass index (BMI) 19 or less, adult: Secondary | ICD-10-CM | POA: Diagnosis not present

## 2019-12-07 DIAGNOSIS — Z0000 Encounter for general adult medical examination without abnormal findings: Secondary | ICD-10-CM | POA: Diagnosis not present

## 2020-03-18 DIAGNOSIS — N921 Excessive and frequent menstruation with irregular cycle: Secondary | ICD-10-CM | POA: Diagnosis not present

## 2020-04-03 DIAGNOSIS — R350 Frequency of micturition: Secondary | ICD-10-CM | POA: Diagnosis not present

## 2020-04-03 DIAGNOSIS — R300 Dysuria: Secondary | ICD-10-CM | POA: Diagnosis not present

## 2020-06-12 ENCOUNTER — Other Ambulatory Visit (HOSPITAL_COMMUNITY): Payer: BLUE CROSS/BLUE SHIELD

## 2020-07-26 ENCOUNTER — Encounter (HOSPITAL_COMMUNITY): Payer: Self-pay

## 2020-07-26 NOTE — Patient Instructions (Addendum)
DUE TO COVID-19 ONLY ONE VISITOR IS ALLOWED IN WAITING ROOM (VISITOR WILL HAVE A TEMPERATURE CHECK ON ARRIVAL AND MUST WEAR A FACE MASK THE ENTIRE TIME.)  ONCE YOU ARE ADMITTED TO YOUR PRIVATE ROOM, THE SAME ONE VISITOR IS ALLOWED TO VISIT DURING VISITING HOURS ONLY.  Your COVID swab testing is scheduled for Monday, Nov. 8, 2021 at 09:05 AM , You must self quarantine after your testing per handout given to you at the testing site. [address] [place], [place] [address]  (Must self quarantine after testing. Follow instructions on handout.)       Your procedure is scheduled on:  Thursday, Nov. 11, 2021  Report to [facility] AT  5:30 A. M.   Call this number if you have problems the morning of surgery:  [phone].   OUR ADDRESS IS [address]  WE ARE LOCATED IN [facility] [place]                                   [facility].                                     REMEMBER:  DO NOT EAT FOOD  AFTER MIDNIGHT .    MAY HAVE LIQUIDS UNTIL 4:30 AM DAY OF SURGERY  CLEAR LIQUID DIET  Foods Allowed                                                                     Foods Excluded  Water, Black Coffee and tea, regular and decaf                             liquids that you cannot  Plain Jell-O in any flavor  (No red)                                           see through such as: Fruit ices (not with fruit pulp)                                     milk, soups, orange juice  Iced Popsicles (No red)                                    All solid food                                   Apple juices Sports drinks like Gatorade (No red) Lightly seasoned clear broth or consume(fat free) Sugar, honey syrup  Sample Menu Breakfast  Lunch                                     Supper Cranberry juice                    Beef broth                            Chicken broth Jell-O                                     Grape juice                           Apple  juice Coffee or tea                        Jell-O                                      Popsicle                                                Coffee or tea                        Coffee or tea  BRUSH YOUR TEETH THE MORNING OF SURGERY.  TAKE THESE MEDICATIONS MORNING OF SURGERY WITH A SIP OF WATER:  CETIRIZINE  DO NOT WEAR JEWERLY, MAKE UP, OR NAIL POLISH.  DO NOT WEAR LOTIONS, POWDERS, PERFUMES/COLOGNE OR DEODORANT.  DO NOT SHAVE FOR 24 HOURS PRIOR TO DAY OF SURGERY.  CONTACTS, GLASSES, OR DENTURES MAY NOT BE WORN TO SURGERY.                                    Ann Leach IS NOT RESPONSIBLE  FOR ANY BELONGINGS.                                                                    [place] - Preparing for Surgery Before surgery, you can play an important role.  Because skin is not sterile, your skin needs to be as free of germs as possible.  You can reduce the number of germs on your skin by washing with CHG (chlorahexidine gluconate) soap before surgery.  CHG is an antiseptic cleaner which kills germs and bonds with the skin to continue killing germs even after washing. Please DO NOT use if you have an allergy to CHG or antibacterial soaps.  If your skin becomes reddened/irritated stop using the CHG and inform your nurse when you arrive at Short Stay. Do not shave (including legs and underarms) for at least 48 hours prior to the first CHG shower.  You  may shave your face/neck.  Please follow these instructions carefully:  1.  Shower with CHG Soap the night before surgery and the  morning of surgery.  2.  If you choose to wash your hair, wash your hair first as usual with your normal  shampoo.  3.  After you shampoo, rinse your hair and body thoroughly to remove the shampoo.                             4.  Use CHG as you would any other liquid soap.  You can apply chg directly to the skin and wash.  Gently with a scrungie or clean washcloth.  5.  Apply the CHG Soap to your body ONLY FROM  THE NECK DOWN.   Do   not use on face/ open                           Wound or open sores. Avoid contact with eyes, ears mouth and   genitals (private parts).                       Wash face,  Genitals (private parts) with your normal soap.             6.  Wash thoroughly, paying special attention to the area where your    surgery  will be performed.  7.  Thoroughly rinse your body with warm water from the neck down.  8.  DO NOT shower/wash with your normal soap after using and rinsing off the CHG Soap.                9.  Pat yourself dry with a clean towel.            10.  Wear clean pajamas.            11.  Place clean sheets on your bed the night of your first shower and do not  sleep with pets. Day of Surgery : Do not apply any lotions/deodorants the morning of surgery.  Please wear clean clothes to the hospital/surgery center.  FAILURE TO FOLLOW THESE INSTRUCTIONS MAY RESULT IN THE CANCELLATION OF YOUR SURGERY  PATIENT SIGNATURE_________________________________  NURSE SIGNATURE__________________________________  ________________________________________________________________________   Ann Leach  An incentive spirometer is a tool that can help keep your lungs clear and active. This tool measures how well you are filling your lungs with each breath. Taking long deep breaths may help reverse or decrease the chance of developing breathing (pulmonary) problems (especially infection) following:  A long period of time when you are unable to move or be active. BEFORE THE PROCEDURE   If the spirometer includes an indicator to show your best effort, your nurse or respiratory therapist will set it to a desired goal.  If possible, sit up straight or lean slightly forward. Try not to slouch.  Hold the incentive spirometer in an upright position. INSTRUCTIONS FOR USE  1. Sit on the edge of your bed if possible, or sit up as far as you can in bed or on a chair. 2. Hold the  incentive spirometer in an upright position. 3. Breathe out normally. 4. Place the mouthpiece in your mouth and seal your lips tightly around it. 5. Breathe in slowly and as deeply as possible, raising the piston or the ball toward the top of the column. 6. Hold your  breath for 3-5 seconds or for as long as possible. Allow the piston or ball to fall to the bottom of the column. 7. Remove the mouthpiece from your mouth and breathe out normally. 8. Rest for a few seconds and repeat Steps 1 through 7 at least 10 times every 1-2 hours when you are awake. Take your time and take a few normal breaths between deep breaths. 9. The spirometer may include an indicator to show your best effort. Use the indicator as a goal to work toward during each repetition. 10. After each set of 10 deep breaths, practice coughing to be sure your lungs are clear. If you have an incision (the cut made at the time of surgery), support your incision when coughing by placing a pillow or rolled up towels firmly against it. Once you are able to get out of bed, walk around indoors and cough well. You may stop using the incentive spirometer when instructed by your caregiver.  RISKS AND COMPLICATIONS  Take your time so you do not get dizzy or light-headed.  If you are in pain, you may need to take or ask for pain medication before doing incentive spirometry. It is harder to take a deep breath if you are having pain. AFTER USE  Rest and breathe slowly and easily.  It can be helpful to keep track of a log of your progress. Your caregiver can provide you with a simple table to help with this. If you are using the spirometer at home, follow these instructions: SEEK MEDICAL CARE IF:   You are having difficultly using the spirometer.  You have trouble using the spirometer as often as instructed.  Your pain medication is not giving enough relief while using the spirometer.  You develop fever of 100.5 F (38.1 C) or  higher. SEEK IMMEDIATE MEDICAL CARE IF:   You cough up bloody sputum that had not been present before.  You develop fever of 102 F (38.9 C) or greater.  You develop worsening pain at or near the incision site. MAKE SURE YOU:   Understand these instructions.  Will watch your condition.  Will get help right away if you are not doing well or get worse. Document Released: 01/25/2007 Document Revised: 12/07/2011 Document Reviewed: 03/28/2007 ExitCare Patient Information 2014 ExitCare, [place].   ________________________________________________________________________  WHAT IS A BLOOD TRANSFUSION? Blood Transfusion Information  A transfusion is the replacement of blood or some of its parts. Blood is made up of multiple cells which provide different functions.  Red blood cells carry oxygen and are used for blood loss replacement.  White blood cells fight against infection.  Platelets control bleeding.  Plasma helps clot blood.  Other blood products are available for specialized needs, such as hemophilia or other clotting disorders. BEFORE THE TRANSFUSION  Who gives blood for transfusions?   Healthy volunteers who are fully evaluated to make sure their blood is safe. This is blood bank blood. Transfusion therapy is the safest it has ever been in the practice of medicine. Before blood is taken from a donor, a complete history is taken to make sure that person has no history of diseases nor engages in risky social behavior (examples are intravenous drug use or sexual activity with multiple partners). The donor's travel history is screened to minimize risk of transmitting infections, such as malaria. The donated blood is tested for signs of infectious diseases, such as HIV and hepatitis. The blood is then tested to be sure it is compatible with you  in order to minimize the chance of a transfusion reaction. If you or a relative donates blood, this is often done in anticipation of surgery  and is not appropriate for emergency situations. It takes many days to process the donated blood. RISKS AND COMPLICATIONS Although transfusion therapy is very safe and saves many lives, the main dangers of transfusion include:   Getting an infectious disease.  Developing a transfusion reaction. This is an allergic reaction to something in the blood you were given. Every precaution is taken to prevent this. The decision to have a blood transfusion has been considered carefully by your caregiver before blood is given. Blood is not given unless the benefits outweigh the risks. AFTER THE TRANSFUSION  Right after receiving a blood transfusion, you will usually feel much better and more energetic. This is especially true if your red blood cells have gotten low (anemic). The transfusion raises the level of the red blood cells which carry oxygen, and this usually causes an energy increase.  The nurse administering the transfusion will monitor you carefully for complications. HOME CARE INSTRUCTIONS  No special instructions are needed after a transfusion. You may find your energy is better. Speak with your caregiver about any limitations on activity for underlying diseases you may have. SEEK MEDICAL CARE IF:   Your condition is not improving after your transfusion.  You develop redness or irritation at the intravenous (IV) site. SEEK IMMEDIATE MEDICAL CARE IF:  Any of the following symptoms occur over the next 12 hours:  Shaking chills.  You have a temperature by mouth above 102 F (38.9 C), not controlled by medicine.  Chest, back, or muscle pain.  People around you feel you are not acting correctly or are confused.  Shortness of breath or difficulty breathing.  Dizziness and fainting.  You get a rash or develop hives.  You have a decrease in urine output.  Your urine turns a dark color or changes to pink, red, or brown. Any of the following symptoms occur over the next 10  days:  You have a temperature by mouth above 102 F (38.9 C), not controlled by medicine.  Shortness of breath.  Weakness after normal activity.  The white part of the eye turns yellow (jaundice).  You have a decrease in the amount of urine or are urinating less often.  Your urine turns a dark color or changes to pink, red, or brown. Document Released: 09/11/2000 Document Revised: 12/07/2011 Document Reviewed: 04/30/2008 [facility] Patient Information 2014 [place], [place].  _______________________________________________________________________

## 2020-07-26 NOTE — Progress Notes (Addendum)
COVID Vaccine Completed: Yes Date COVID Vaccine completed: 06/05/20, 07/03/20  COVID vaccine manufacturer:     Moderna     PCP -  Dr. Donzetta Sprung Cardiologist - N/A  Chest x-ray - N/A EKG - N/A Stress Test - N/A ECHO - N/A Cardiac Cath - N/A Pacemaker/ICD device last checked: N/A  Sleep Study - N/A CPAP -  N/A  Fasting Blood Sugar -  N/A Checks Blood Sugar _ N/A____ times a day   Blood Thinner Instructions: N/A Aspirin Instructions:N/A Last Dose:N/A  Anesthesia review:  N/A  Patient denies shortness of breath, fever, cough and chest pain at PAT appointment   Patient verbalized understanding of instructions that were given to them at the PAT appointment. Patient was also instructed that they will need to review over the PAT instructions again at home before surgery.

## 2020-07-29 ENCOUNTER — Other Ambulatory Visit: Payer: Self-pay

## 2020-07-29 ENCOUNTER — Encounter (HOSPITAL_COMMUNITY)
Admission: RE | Admit: 2020-07-29 | Discharge: 2020-07-29 | Disposition: A | Discharge status: Home / Self Care | Payer: BC Managed Care – PPO | Source: Ambulatory Visit | Attending: Obstetrics and Gynecology | Admitting: Obstetrics and Gynecology

## 2020-07-29 ENCOUNTER — Encounter (HOSPITAL_COMMUNITY): Payer: Self-pay

## 2020-07-29 DIAGNOSIS — Z01812 Encounter for preprocedural laboratory examination: Secondary | ICD-10-CM | POA: Diagnosis not present

## 2020-07-29 DIAGNOSIS — Z0189 Encounter for other specified special examinations: Secondary | ICD-10-CM | POA: Diagnosis not present

## 2020-07-29 HISTORY — DX: Gastro-esophageal reflux disease without esophagitis: K21.9

## 2020-07-29 HISTORY — DX: Other specified postprocedural states: R11.2

## 2020-07-29 HISTORY — DX: Cardiac murmur, unspecified: R01.1

## 2020-07-29 HISTORY — DX: Acute embolism and thrombosis of unspecified deep veins of unspecified lower extremity: I82.409

## 2020-07-29 HISTORY — DX: Other specified postprocedural states: Z98.890

## 2020-07-29 HISTORY — DX: Other seasonal allergic rhinitis: J30.2

## 2020-07-29 HISTORY — DX: Anemia, unspecified: D64.9

## 2020-07-29 LAB — CBC
HCT: 37 % (ref 36.0–46.0)
Hemoglobin: 12.3 g/dL (ref 12.0–15.0)
MCH: 30.8 pg (ref 26.0–34.0)
MCHC: 33.2 g/dL (ref 30.0–36.0)
MCV: 92.7 fL (ref 80.0–100.0)
Platelets: 176 10*3/uL (ref 150–400)
RBC: 3.99 MIL/uL (ref 3.87–5.11)
RDW: 12.9 % (ref 11.5–15.5)
WBC: 5.2 10*3/uL (ref 4.0–10.5)
nRBC: 0 % (ref 0.0–0.2)

## 2020-08-05 ENCOUNTER — Other Ambulatory Visit (HOSPITAL_COMMUNITY)
Admission: RE | Admit: 2020-08-05 | Discharge: 2020-08-05 | Disposition: A | Discharge status: Home / Self Care | Payer: BC Managed Care – PPO | Source: Ambulatory Visit | Attending: Obstetrics and Gynecology | Admitting: Obstetrics and Gynecology

## 2020-08-05 DIAGNOSIS — Z20822 Contact with and (suspected) exposure to covid-19: Secondary | ICD-10-CM | POA: Diagnosis not present

## 2020-08-05 DIAGNOSIS — Z01818 Encounter for other preprocedural examination: Secondary | ICD-10-CM | POA: Diagnosis not present

## 2020-08-05 LAB — SARS CORONAVIRUS 2 (TAT 6-24 HRS): SARS Coronavirus 2: NEGATIVE

## 2020-08-07 NOTE — Anesthesia Preprocedure Evaluation (Addendum)
Anesthesia Evaluation  Patient identified by MRN, date of birth, ID band Patient awake    Reviewed: Allergy & Precautions, NPO status , Patient's Chart, lab work & pertinent test results  History of Anesthesia Complications (+) PONV and history of anesthetic complications  Airway Mallampati: I  TM Distance: >3 FB Neck ROM: Full    Dental no notable dental hx. (+) Dental Advisory Given, Teeth Intact   Pulmonary neg pulmonary ROS,    Pulmonary exam normal breath sounds clear to auscultation       Cardiovascular Normal cardiovascular exam+ Valvular Problems/Murmurs  Rhythm:Regular Rate:Normal     Neuro/Psych negative neurological ROS     GI/Hepatic Neg liver ROS, GERD  ,  Endo/Other  negative endocrine ROS  Renal/GU negative Renal ROS     Musculoskeletal negative musculoskeletal ROS (+)   Abdominal   Peds  Hematology  (+) Blood dyscrasia, anemia ,   Anesthesia Other Findings   Reproductive/Obstetrics                            Anesthesia Physical Anesthesia Plan  ASA: II  Anesthesia Plan: General   Post-op Pain Management:    Induction: Intravenous  PONV Risk Score and Plan: 4 or greater and Ondansetron, Dexamethasone, Propofol infusion, Treatment may vary due to age or medical condition, Midazolam and Scopolamine patch - Pre-op  Airway Management Planned: Oral ETT  Additional Equipment: None  Intra-op Plan:   Post-operative Plan: Extubation in OR  Informed Consent: I have reviewed the patients History and Physical, chart, labs and discussed the procedure including the risks, benefits and alternatives for the proposed anesthesia with the patient or authorized representative who has indicated his/her understanding and acceptance.     Dental advisory given  Plan Discussed with: CRNA  Anesthesia Plan Comments:        Anesthesia Quick Evaluation

## 2020-08-07 NOTE — H&P (Signed)
Ann Leach is an 39 y.o. female. She was seen as a new patient in June.  After her last delivery in 2018, a c-section for triplets, she had persistent abnormal bleeding.  She had a Mirena placed which did not help.  January of 2020, she had IUD removed, hysteroscopy and Novasure endometrial ablation.  Still having menses monthly, lighter, flow varies and can last up to 14 days, some cramps.  Sexually active, no problems, has BTL.  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  She desires definitive surgical therapy for this bleeding problem.  Pertinent Gynecological History: Last pap: normal Date: 03/2018 OB History: G3, P2105 SVD x 2, c-section for triplets   Menstrual History: No LMP recorded (lmp unknown).    Past Medical History:  Diagnosis Date  . Anemia    after pregnancy  . DVT (deep venous thrombosis) (HCC)   . GERD (gastroesophageal reflux disease)    history of during and shortly after pregnancy  . Heart murmur    told in high school and no issues  . Medical history non-contributory   . PONV (postoperative nausea and vomiting)    after spinal anesthesia  . 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 bunionectomies     x2  . CESAREAN SECTION    . TUBAL LIGATION    . uterine ablation      Family History  Problem Relation Age of Onset  . Other Daughter        Pendred syndrome  . Other Daughter        Pendred syndrome    Social History:  reports that she has never smoked. She has never used smokeless tobacco. She reports that she does not drink alcohol and does not use drugs.  Allergies:  Allergies  Allergen Reactions  . Codeine Nausea Only    No medications prior to admission.    Review of Systems  Respiratory: Negative.   Cardiovascular: Negative.     unknown if  currently breastfeeding. Physical Exam Constitutional:      Appearance: Normal appearance.  Cardiovascular:     Rate and Rhythm: Normal rate and regular rhythm.     Pulses: Normal pulses.     Heart sounds: Normal heart sounds.  Pulmonary:     Effort: Pulmonary effort is normal. No respiratory distress.     Breath sounds: Normal breath sounds.  Abdominal:     General: There is no distension.     Palpations: Abdomen is soft. There is no mass.     Tenderness: There is no abdominal tenderness.  Genitourinary:    Comments: Normal uterus No adnexal mass Musculoskeletal:     Cervical back: Normal range of motion and neck supple.  Neurological:     Mental Status: She is alert.     No results found for this or any previous visit (from the past 24 hour(s)).  No results found.  Assessment/Plan: Menometrorrhagia, previous Novasure ablation.  Discussed all medical and surgical options.  She wants definitive surgical treatment with shortest recovery.  Discussed surgical procedure, risks, alternatives, chances of relieving her symptoms, all questions answered.  Will admit for [facility], bilateral salpingectomy if any tubes left.    [name] 08/07/2020, 6:58 PM

## 2020-08-08 ENCOUNTER — Encounter (HOSPITAL_BASED_OUTPATIENT_CLINIC_OR_DEPARTMENT_OTHER)
Admission: RE | Disposition: A | Discharge status: Home / Self Care | Payer: Self-pay | Source: Other Acute Inpatient Hospital | Attending: Obstetrics and Gynecology

## 2020-08-08 ENCOUNTER — Other Ambulatory Visit: Payer: Self-pay

## 2020-08-08 ENCOUNTER — Ambulatory Visit (HOSPITAL_BASED_OUTPATIENT_CLINIC_OR_DEPARTMENT_OTHER): Payer: BC Managed Care – PPO | Admitting: Anesthesiology

## 2020-08-08 ENCOUNTER — Ambulatory Visit (HOSPITAL_BASED_OUTPATIENT_CLINIC_OR_DEPARTMENT_OTHER)
Admission: RE | Admit: 2020-08-08 | Discharge: 2020-08-08 | Disposition: A | Discharge status: Home / Self Care | Payer: BC Managed Care – PPO | Source: Other Acute Inpatient Hospital | Attending: Obstetrics and Gynecology | Admitting: Obstetrics and Gynecology

## 2020-08-08 ENCOUNTER — Encounter (HOSPITAL_BASED_OUTPATIENT_CLINIC_OR_DEPARTMENT_OTHER): Payer: Self-pay | Admitting: Obstetrics and Gynecology

## 2020-08-08 DIAGNOSIS — K219 Gastro-esophageal reflux disease without esophagitis: Secondary | ICD-10-CM | POA: Diagnosis not present

## 2020-08-08 DIAGNOSIS — N858 Other specified noninflammatory disorders of uterus: Secondary | ICD-10-CM | POA: Diagnosis not present

## 2020-08-08 DIAGNOSIS — Z86718 Personal history of other venous thrombosis and embolism: Secondary | ICD-10-CM | POA: Diagnosis not present

## 2020-08-08 DIAGNOSIS — J302 Other seasonal allergic rhinitis: Secondary | ICD-10-CM | POA: Diagnosis not present

## 2020-08-08 DIAGNOSIS — Z90711 Acquired absence of uterus with remaining cervical stump: Secondary | ICD-10-CM | POA: Diagnosis not present

## 2020-08-08 DIAGNOSIS — N921 Excessive and frequent menstruation with irregular cycle: Secondary | ICD-10-CM | POA: Diagnosis not present

## 2020-08-08 DIAGNOSIS — Z7982 Long term (current) use of aspirin: Secondary | ICD-10-CM | POA: Insufficient documentation

## 2020-08-08 DIAGNOSIS — Z79899 Other long term (current) drug therapy: Secondary | ICD-10-CM | POA: Diagnosis not present

## 2020-08-08 DIAGNOSIS — Z885 Allergy status to narcotic agent status: Secondary | ICD-10-CM | POA: Diagnosis not present

## 2020-08-08 DIAGNOSIS — D649 Anemia, unspecified: Secondary | ICD-10-CM | POA: Diagnosis not present

## 2020-08-08 DIAGNOSIS — Z791 Long term (current) use of non-steroidal anti-inflammatories (NSAID): Secondary | ICD-10-CM | POA: Diagnosis not present

## 2020-08-08 HISTORY — PX: LAPAROSCOPIC SUPRACERVICAL HYSTERECTOMY: SHX5399

## 2020-08-08 HISTORY — PX: LAPAROSCOPIC BILATERAL SALPINGECTOMY: SHX5889

## 2020-08-08 LAB — TYPE AND SCREEN
ABO/RH(D): A POS
Antibody Screen: POSITIVE
PT AG Type: NEGATIVE
Unit division: 0
Unit division: 0

## 2020-08-08 LAB — BPAM RBC
Blood Product Expiration Date: 202111212359
Blood Product Expiration Date: 202111262359
Unit Type and Rh: 6200
Unit Type and Rh: 6200

## 2020-08-08 LAB — POCT PREGNANCY, URINE: Preg Test, Ur: NEGATIVE

## 2020-08-08 SURGERY — HYSTERECTOMY, SUPRACERVICAL, LAPAROSCOPIC
Anesthesia: General | Site: Abdomen

## 2020-08-08 MED ORDER — DEXAMETHASONE SODIUM PHOSPHATE 10 MG/ML IJ SOLN
INTRAMUSCULAR | Status: AC
  Filled 2020-08-08: qty 1

## 2020-08-08 MED ORDER — KETOROLAC TROMETHAMINE 30 MG/ML IJ SOLN
30.0000 mg | Freq: Four times a day (QID) | INTRAMUSCULAR | Status: DC
  Administered 2020-08-08: 30 mg via INTRAVENOUS

## 2020-08-08 MED ORDER — CEFAZOLIN SODIUM-DEXTROSE 2-4 GM/100ML-% IV SOLN
2.0000 g | INTRAVENOUS | Status: AC
  Administered 2020-08-08: 2 g via INTRAVENOUS

## 2020-08-08 MED ORDER — LACTATED RINGERS IV SOLN: INTRAVENOUS | Status: DC

## 2020-08-08 MED ORDER — OXYCODONE HCL 5 MG PO TABS
5.0000 mg | ORAL_TABLET | ORAL | 0 refills | Status: AC | PRN
Start: 2020-08-08 — End: ?

## 2020-08-08 MED ORDER — MEPERIDINE HCL 25 MG/ML IJ SOLN: 6.2500 mg | INTRAMUSCULAR | Status: DC | PRN

## 2020-08-08 MED ORDER — EPHEDRINE SULFATE-NACL 50-0.9 MG/10ML-% IV SOSY
PREFILLED_SYRINGE | INTRAVENOUS | Status: DC | PRN
  Administered 2020-08-08: 10 mg via INTRAVENOUS

## 2020-08-08 MED ORDER — IBUPROFEN 800 MG PO TABS: 800.0000 mg | ORAL_TABLET | Freq: Three times a day (TID) | ORAL | 0 refills | Status: DC | PRN

## 2020-08-08 MED ORDER — GABAPENTIN 300 MG PO CAPS
300.0000 mg | ORAL_CAPSULE | ORAL | Status: AC
  Administered 2020-08-08: 300 mg via ORAL

## 2020-08-08 MED ORDER — HYDROMORPHONE HCL 1 MG/ML IJ SOLN: 1.0000 mg | INTRAMUSCULAR | Status: DC | PRN

## 2020-08-08 MED ORDER — FENTANYL CITRATE (PF) 100 MCG/2ML IJ SOLN
INTRAMUSCULAR | Status: DC | PRN
  Administered 2020-08-08 (×2): 50 ug via INTRAVENOUS

## 2020-08-08 MED ORDER — GABAPENTIN 300 MG PO CAPS
300.0000 mg | ORAL_CAPSULE | Freq: Three times a day (TID) | ORAL | Status: DC
  Administered 2020-08-08: 300 mg via ORAL

## 2020-08-08 MED ORDER — ACETAMINOPHEN 500 MG PO TABS
ORAL_TABLET | ORAL | Status: AC
  Filled 2020-08-08: qty 2

## 2020-08-08 MED ORDER — ONDANSETRON HCL 4 MG/2ML IJ SOLN
INTRAMUSCULAR | Status: AC
  Filled 2020-08-08: qty 2

## 2020-08-08 MED ORDER — ONDANSETRON HCL 4 MG/2ML IJ SOLN
INTRAMUSCULAR | Status: DC | PRN
  Administered 2020-08-08: 4 mg via INTRAVENOUS

## 2020-08-08 MED ORDER — ONDANSETRON HCL 4 MG PO TABS: 4.0000 mg | ORAL_TABLET | Freq: Four times a day (QID) | ORAL | Status: DC | PRN

## 2020-08-08 MED ORDER — OXYCODONE HCL 5 MG PO TABS: 5.0000 mg | ORAL_TABLET | ORAL | Status: DC | PRN

## 2020-08-08 MED ORDER — MENTHOL 3 MG MT LOZG: 1.0000 | LOZENGE | OROMUCOSAL | Status: DC | PRN

## 2020-08-08 MED ORDER — KETOROLAC TROMETHAMINE 30 MG/ML IJ SOLN
INTRAMUSCULAR | Status: AC
  Filled 2020-08-08: qty 1

## 2020-08-08 MED ORDER — IBUPROFEN 800 MG PO TABS: 800.0000 mg | ORAL_TABLET | Freq: Four times a day (QID) | ORAL | Status: DC

## 2020-08-08 MED ORDER — KETOROLAC TROMETHAMINE 30 MG/ML IJ SOLN
INTRAMUSCULAR | Status: DC | PRN
  Administered 2020-08-08: 30 mg via INTRAVENOUS

## 2020-08-08 MED ORDER — GABAPENTIN 300 MG PO CAPS: 300.0000 mg | ORAL_CAPSULE | Freq: Three times a day (TID) | ORAL | 0 refills | Status: DC

## 2020-08-08 MED ORDER — LIDOCAINE 2% (20 MG/ML) 5 ML SYRINGE
INTRAMUSCULAR | Status: AC
  Filled 2020-08-08: qty 5

## 2020-08-08 MED ORDER — ROCURONIUM BROMIDE 10 MG/ML (PF) SYRINGE
PREFILLED_SYRINGE | INTRAVENOUS | Status: DC | PRN
  Administered 2020-08-08: 50 mg via INTRAVENOUS

## 2020-08-08 MED ORDER — PROPOFOL 10 MG/ML IV BOLUS
INTRAVENOUS | Status: DC | PRN
  Administered 2020-08-08: 140 mg via INTRAVENOUS

## 2020-08-08 MED ORDER — SIMETHICONE 80 MG PO CHEW: 80.0000 mg | CHEWABLE_TABLET | Freq: Four times a day (QID) | ORAL | Status: DC | PRN

## 2020-08-08 MED ORDER — POVIDONE-IODINE 10 % EX SWAB: 2.0000 "application " | Freq: Once | CUTANEOUS | Status: DC

## 2020-08-08 MED ORDER — ALUM & MAG HYDROXIDE-SIMETH 200-200-20 MG/5ML PO SUSP: 30.0000 mL | ORAL | Status: DC | PRN

## 2020-08-08 MED ORDER — FENTANYL CITRATE (PF) 100 MCG/2ML IJ SOLN: 25.0000 ug | INTRAMUSCULAR | Status: DC | PRN

## 2020-08-08 MED ORDER — PROMETHAZINE HCL 25 MG/ML IJ SOLN: 6.2500 mg | INTRAMUSCULAR | Status: DC | PRN

## 2020-08-08 MED ORDER — EPHEDRINE 5 MG/ML INJ
INTRAVENOUS | Status: AC
  Filled 2020-08-08: qty 10

## 2020-08-08 MED ORDER — DEXTROSE-NACL 5-0.45 % IV SOLN: INTRAVENOUS | Status: DC

## 2020-08-08 MED ORDER — CEFAZOLIN SODIUM-DEXTROSE 2-4 GM/100ML-% IV SOLN
INTRAVENOUS | Status: AC
  Filled 2020-08-08: qty 100

## 2020-08-08 MED ORDER — OXYCODONE HCL 5 MG PO TABS: 5.0000 mg | ORAL_TABLET | Freq: Once | ORAL | Status: DC | PRN

## 2020-08-08 MED ORDER — SODIUM CHLORIDE 0.9 % IR SOLN
Status: DC | PRN
  Administered 2020-08-08: 1500 mL

## 2020-08-08 MED ORDER — SCOPOLAMINE 1 MG/3DAYS TD PT72
MEDICATED_PATCH | TRANSDERMAL | Status: AC
  Filled 2020-08-08: qty 1

## 2020-08-08 MED ORDER — FENTANYL CITRATE (PF) 100 MCG/2ML IJ SOLN
INTRAMUSCULAR | Status: AC
  Filled 2020-08-08: qty 2

## 2020-08-08 MED ORDER — LIDOCAINE 2% (20 MG/ML) 5 ML SYRINGE
INTRAMUSCULAR | Status: DC | PRN
  Administered 2020-08-08: 100 mg via INTRAVENOUS

## 2020-08-08 MED ORDER — ACETAMINOPHEN 500 MG PO TABS
1000.0000 mg | ORAL_TABLET | ORAL | Status: AC
  Administered 2020-08-08: 1000 mg via ORAL

## 2020-08-08 MED ORDER — SUGAMMADEX SODIUM 200 MG/2ML IV SOLN
INTRAVENOUS | Status: DC | PRN
  Administered 2020-08-08: 120 mg via INTRAVENOUS

## 2020-08-08 MED ORDER — KETOROLAC TROMETHAMINE 30 MG/ML IJ SOLN: 30.0000 mg | Freq: Once | INTRAMUSCULAR | Status: DC

## 2020-08-08 MED ORDER — BUPIVACAINE HCL (PF) 0.25 % IJ SOLN
INTRAMUSCULAR | Status: DC | PRN
  Administered 2020-08-08: 18 mL

## 2020-08-08 MED ORDER — OXYCODONE HCL 5 MG/5ML PO SOLN: 5.0000 mg | Freq: Once | ORAL | Status: DC | PRN

## 2020-08-08 MED ORDER — ONDANSETRON HCL 4 MG/2ML IJ SOLN: 4.0000 mg | Freq: Four times a day (QID) | INTRAMUSCULAR | Status: DC | PRN

## 2020-08-08 MED ORDER — MIDAZOLAM HCL 2 MG/2ML IJ SOLN
INTRAMUSCULAR | Status: DC | PRN
  Administered 2020-08-08: 2 mg via INTRAVENOUS

## 2020-08-08 MED ORDER — DEXAMETHASONE SODIUM PHOSPHATE 10 MG/ML IJ SOLN
INTRAMUSCULAR | Status: DC | PRN
  Administered 2020-08-08 (×2): 5 mg via INTRAVENOUS

## 2020-08-08 MED ORDER — MIDAZOLAM HCL 2 MG/2ML IJ SOLN
INTRAMUSCULAR | Status: AC
  Filled 2020-08-08: qty 2

## 2020-08-08 MED ORDER — ACETAMINOPHEN 500 MG PO TABS
1000.0000 mg | ORAL_TABLET | Freq: Four times a day (QID) | ORAL | Status: DC
  Administered 2020-08-08: 1000 mg via ORAL

## 2020-08-08 MED ORDER — GABAPENTIN 300 MG PO CAPS
ORAL_CAPSULE | ORAL | Status: AC
  Filled 2020-08-08: qty 1

## 2020-08-08 MED ORDER — BISACODYL 10 MG RE SUPP: 10.0000 mg | Freq: Every day | RECTAL | Status: DC | PRN

## 2020-08-08 MED ORDER — PROPOFOL 10 MG/ML IV BOLUS
INTRAVENOUS | Status: AC
  Filled 2020-08-08: qty 20

## 2020-08-08 MED ORDER — SCOPOLAMINE 1 MG/3DAYS TD PT72
1.0000 | MEDICATED_PATCH | TRANSDERMAL | Status: DC
  Administered 2020-08-08: 1.5 mg via TRANSDERMAL

## 2020-08-08 MED ORDER — ROCURONIUM BROMIDE 10 MG/ML (PF) SYRINGE
PREFILLED_SYRINGE | INTRAVENOUS | Status: AC
  Filled 2020-08-08: qty 10

## 2020-08-08 SURGICAL SUPPLY — 53 items
ADH SKN CLS APL DERMABOND .7 (GAUZE/BANDAGES/DRESSINGS) ×2
BAG RETRIEVAL 10 (BASKET)
BAG RETRIEVAL 10MM (BASKET)
BAG SPEC RTRVL LRG 6X4 10 (ENDOMECHANICALS) ×2
BARRIER ADHS 3X4 INTERCEED (GAUZE/BANDAGES/DRESSINGS) ×4 IMPLANT
BRR ADH 4X3 ABS CNTRL BYND (GAUZE/BANDAGES/DRESSINGS) ×2
CABLE HIGH FREQUENCY MONO STRZ (ELECTRODE) IMPLANT
CATH ROBINSON RED A/P 16FR (CATHETERS) IMPLANT
CELL SAVER LIPIGURD (MISCELLANEOUS) IMPLANT
COVER MAYO STAND STRL (DRAPES) ×4 IMPLANT
COVER WAND RF STERILE (DRAPES) ×4 IMPLANT
DERMABOND ADVANCED (GAUZE/BANDAGES/DRESSINGS) ×2
DERMABOND ADVANCED .7 DNX12 (GAUZE/BANDAGES/DRESSINGS) ×2 IMPLANT
DRSG COVADERM PLUS 2X2 (GAUZE/BANDAGES/DRESSINGS) IMPLANT
DRSG OPSITE POSTOP 3X4 (GAUZE/BANDAGES/DRESSINGS) IMPLANT
DURAPREP 26ML APPLICATOR (WOUND CARE) ×4 IMPLANT
EXTRT SYSTEM ALEXIS 14CM (MISCELLANEOUS)
GAUZE 4X4 16PLY RFD (DISPOSABLE) ×4 IMPLANT
GLOVE BIO SURGEON STRL SZ8 (GLOVE) ×4 IMPLANT
GLOVE BIOGEL PI IND STRL 7.0 (GLOVE) ×4 IMPLANT
GLOVE BIOGEL PI IND STRL 8 (GLOVE) ×2 IMPLANT
GLOVE BIOGEL PI INDICATOR 7.0 (GLOVE) ×4
GLOVE BIOGEL PI INDICATOR 8 (GLOVE) ×2
GLOVE ORTHO TXT STRL SZ7.5 (GLOVE) ×4 IMPLANT
GOWN STRL REUS W/TWL LRG LVL3 (GOWN DISPOSABLE) ×8 IMPLANT
GOWN STRL REUS W/TWL XL LVL3 (GOWN DISPOSABLE) ×4 IMPLANT
IV NS IRRIG 3000ML ARTHROMATIC (IV SOLUTION) ×4 IMPLANT
NEEDLE INSUFFLATION 120MM (ENDOMECHANICALS) ×4 IMPLANT
NS IRRIG 1000ML POUR BTL (IV SOLUTION) IMPLANT
NS IRRIG 500ML POUR BTL (IV SOLUTION) ×4 IMPLANT
PACK LAPAROSCOPY BASIN (CUSTOM PROCEDURE TRAY) ×4 IMPLANT
PACK TRENDGUARD 450 HYBRID PRO (MISCELLANEOUS) ×2 IMPLANT
POUCH SPECIMEN RETRIEVAL 10MM (ENDOMECHANICALS) ×4 IMPLANT
PROTECTOR NERVE ULNAR (MISCELLANEOUS) IMPLANT
SET SUCTION IRRIG HYDROSURG (IRRIGATION / IRRIGATOR) ×4 IMPLANT
SET TRI-LUMEN FLTR TB AIRSEAL (TUBING) ×4 IMPLANT
SET TUBE SMOKE EVAC HIGH FLOW (TUBING) IMPLANT
SHEARS HARMONIC ACE PLUS 36CM (ENDOMECHANICALS) ×4 IMPLANT
SLEEVE XCEL OPT CAN 5 100 (ENDOMECHANICALS) ×4 IMPLANT
SUT VIC AB 3-0 PS2 18 (SUTURE) ×4
SUT VIC AB 3-0 PS2 18XBRD (SUTURE) ×2 IMPLANT
SUT VIC AB 4-0 PS2 18 (SUTURE) ×4 IMPLANT
SUT VICRYL 0 UR6 27IN ABS (SUTURE) ×4 IMPLANT
SYS BAG RETRIEVAL 10MM (BASKET)
SYSTEM BAG RETRIEVAL 10MM (BASKET) IMPLANT
TOWEL OR 17X26 10 PK STRL BLUE (TOWEL DISPOSABLE) ×4 IMPLANT
TRAY FOLEY W/BAG SLVR 14FR LF (SET/KITS/TRAYS/PACK) ×4 IMPLANT
TRENDGUARD 450 HYBRID PRO PACK (MISCELLANEOUS) ×4
TROCAR BALLN 12MMX100 BLUNT (TROCAR) ×4 IMPLANT
TROCAR BLADELESS OPT 5 100 (ENDOMECHANICALS) ×4 IMPLANT
TROCAR XCEL BLUNT TIP 100MML (ENDOMECHANICALS) IMPLANT
TROCAR XCEL NON-BLD 11X100MML (ENDOMECHANICALS) IMPLANT
WARMER LAPAROSCOPE (MISCELLANEOUS) ×4 IMPLANT

## 2020-08-08 NOTE — Discharge Summary (Signed)
Physician Discharge Summary  Patient ID: Ann Leach MRN: [id number] DOB/AGE: 1980-12-14 39 y.o.  Admit date: 08/08/2020 Discharge date: 08/08/2020  Admission Diagnoses:  Menometrorrhagia  Discharge Diagnoses: Menometrorrhagia Active Problems:   Menometrorrhagia   S/P laparoscopic supracervical hysterectomy   Discharged Condition: good  Hospital Course: Admitted and underwent LSH, bilateral salpingectomy without difficulty, small uterus, normal ovaries.  Post-op she quickly met milestones and was stable for and requested discharge evening of surgery   Discharge Exam: Blood pressure (!) 100/56, pulse 66, temperature 98.9 F (37.2 C), resp. rate 14, height 5' 9"  (1.753 m), weight 58.3 kg, SpO2 99 %, unknown if currently breastfeeding. General appearance: alert  Disposition: Discharge disposition: 01-Home or Self Care       Discharge Instructions    Call MD for:  difficulty breathing, headache or visual disturbances   Complete by: As directed    Call MD for:  extreme fatigue   Complete by: As directed    Call MD for:  persistant dizziness or light-headedness   Complete by: As directed    Call MD for:  persistant nausea and vomiting   Complete by: As directed    Call MD for:  redness, tenderness, or signs of infection (pain, swelling, redness, odor or green/yellow discharge around incision site)   Complete by: As directed    Call MD for:  severe uncontrolled pain   Complete by: As directed    Call MD for:  temperature >100.4   Complete by: As directed    Diet - low sodium heart healthy   Complete by: As directed    Increase activity slowly   Complete by: As directed      Allergies as of 08/08/2020      Reactions   Codeine Nausea Only      Medication List    TAKE these medications   aspirin EC 81 MG tablet Take 81 mg by mouth daily.   cetirizine 10 MG tablet Commonly known as: ZYRTEC Take 10 mg by mouth daily.   FLUoxetine 10 MG capsule Commonly  known as: PROZAC Take 10 mg by mouth daily.   gabapentin 300 MG capsule Commonly known as: NEURONTIN Take 1 capsule (300 mg total) by mouth 3 (three) times daily for 2 days.   ibuprofen 800 MG tablet Commonly known as: ADVIL Take 1 tablet (800 mg total) by mouth every 8 (eight) hours as needed for moderate pain.   IRON PO Take by mouth.   oxyCODONE 5 MG immediate release tablet Commonly known as: Oxy IR/ROXICODONE Take 1-2 tablets (5-10 mg total) by mouth every 4 (four) hours as needed for severe pain.   PHENERGAN PO Take by mouth.   PRENATAL VITAMIN PO Take by mouth.       Follow-up Information    Ann Laymon, MD. Schedule an appointment as soon as possible for a visit in 2 week(s).   Specialty: Obstetrics and Gynecology Contact information: [address] [place] [address] [place] [address] [phone]               Signed: Blane Ohara Elbony Mcclimans 08/08/2020, 4:56 PM

## 2020-08-08 NOTE — Transfer of Care (Signed)
Immediate Anesthesia Transfer of Care Note  Patient: Ann Leach  Procedure(s) Performed: Procedure(s) (LRB): LAPAROSCOPIC SUPRACERVICAL HYSTERECTOMY (N/A) LAPAROSCOPIC BILATERAL SALPINGECTOMY (Bilateral)  Patient Location: PACU  Anesthesia Type: General  Level of Consciousness: awake, oriented, sedated and patient cooperative  Airway & Oxygen Therapy: Patient Spontanous Breathing and Patient connected to face mask oxygen  Post-op Assessment: Report given to PACU RN and Post -op Vital signs reviewed and stable  Post vital signs: Reviewed and stable  Complications: No apparent anesthesia complications Last Vitals:  Vitals Value Taken Time  BP 119/56 08/08/20 0926  Temp    Pulse 76 08/08/20 0929  Resp 21 08/08/20 0929  SpO2 100 % 08/08/20 0929  Vitals shown include unvalidated device data.  Last Pain:  Vitals:   08/08/20 0605  TempSrc: Oral  PainSc: 0-No pain      Patients Stated Pain Goal: 6 (08/08/20 0962)  Complications: No complications documented.

## 2020-08-08 NOTE — Anesthesia Postprocedure Evaluation (Signed)
Anesthesia Post Note  Patient: Ann Leach  Procedure(s) Performed: LAPAROSCOPIC SUPRACERVICAL HYSTERECTOMY (N/A Abdomen) LAPAROSCOPIC BILATERAL SALPINGECTOMY (Bilateral Abdomen)     Patient location during evaluation: PACU Anesthesia Type: General Level of consciousness: sedated and patient cooperative Pain management: pain level controlled Vital Signs Assessment: post-procedure vital signs reviewed and stable Respiratory status: spontaneous breathing Cardiovascular status: stable Anesthetic complications: no   No complications documented.  Last Vitals:  Vitals:   08/08/20 1125 08/08/20 1223  BP: 111/63 111/64  Pulse: (!) 59 64  Resp: 16 15  Temp: 36.7 C 36.4 C  SpO2: 100% 100%    Last Pain:  Vitals:   08/08/20 1223  TempSrc:   PainSc: 0-No pain                 Lewie Loron

## 2020-08-08 NOTE — Discharge Instructions (Signed)
Total Laparoscopic Hysterectomy, Care After This sheet gives you information about how to care for yourself after your procedure. Your health care provider may also give you more specific instructions. If you have problems or questions, contact your health care provider. What can I expect after the procedure? After the procedure, it is common to have:  Pain and bruising around your incisions.  A sore throat, if a breathing tube was used during surgery.  Fatigue.  Poor appetite.  Less interest in sex. If your ovaries were also removed, it is also common to have symptoms of menopause such as hot flashes, night sweats, and lack of sleep (insomnia). Follow these instructions at home: Bathing  Do not take baths, swim, or use a hot tub until your health care provider approves. You may need to only take showers for 2-3 weeks.  Keep your bandage (dressing) dry until your health care provider says it can be removed. Incision care   Follow instructions from your health care provider about how to take care of your incisions. Make sure you: ? Wash your hands with soap and water before you change your dressing. If soap and water are not available, use hand sanitizer. ? Change your dressing as told by your health care provider. ? Leave stitches (sutures), skin glue, or adhesive strips in place. These skin closures may need to stay in place for 2 weeks or longer. If adhesive strip edges start to loosen and curl up, you may trim the loose edges. Do not remove adhesive strips completely unless your health care provider tells you to do that.  Check your incision area every day for signs of infection. Check for: ? Redness, swelling, or pain. ? Fluid or blood. ? Warmth. ? Pus or a bad smell. Activity  Get plenty of rest and sleep.  Do not lift anything that is heavier than 10 lbs (4.5 kg) for one month after surgery, or as long as told by your health care provider.  Do not drive or use heavy  machinery while taking prescription pain medicine.  Do not drive for 24 hours if you were given a medicine to help you relax (sedative).  Return to your normal activities as told by your health care provider. Ask your health care provider what activities are safe for you. Lifestyle   Do not use any products that contain nicotine or tobacco, such as cigarettes and e-cigarettes. These can delay healing. If you need help quitting, ask your health care provider.  Do not drink alcohol until your health care provider approves. General instructions  Do not douche, use tampons, or have sex for at least 6 weeks, or as told by your health care provider.  Take over-the-counter and prescription medicines only as told by your health care provider.  To monitor yourself for a fever, take your temperature at least once a day during recovery.  If you struggle with physical or emotional changes after your procedure, speak with your health care provider or a therapist.  To prevent or treat constipation while you are taking prescription pain medicine, your health care provider may recommend that you: ? Drink enough fluid to keep your urine clear or pale yellow. ? Take over-the-counter or prescription medicines. ? Eat foods that are high in fiber, such as fresh fruits and vegetables, whole grains, and beans. ? Limit foods that are high in fat and processed sugars, such as fried and sweet foods.  Keep all follow-up visits as told by your health care provider.   This is important. Contact a health care provider if:  You have chills or a fever.  You have redness, swelling, or pain around an incision.  You have fluid or blood coming from an incision.  Your incision feels warm to the touch.  You have pus or a bad smell coming from an incision.  An incision breaks open.  You feel dizzy or light-headed.  You have pain or bleeding when you urinate.  You have diarrhea, nausea, or vomiting that does not  go away.  You have abnormal vaginal discharge.  You have a rash.  You have pain that does not get better with medicine. Get help right away if:  You have a fever and your symptoms suddenly get worse.  You have severe abdominal pain.  You have chest pain.  You have shortness of breath.  You faint.  You have pain, swelling, or redness on your leg.  You have heavy vaginal bleeding with blood clots. Summary  After the procedure it is common to have abdominal pain. Your provider will give you medication for this.  Do not take baths, swim, or use a hot tub until your health care provider approves.  Do not lift anything that is heavier than 10 lbs (4.5 kg) for one month after surgery, or as long as told by your health care provider.  Notify your provider if you have any signs or symptoms of infection after the procedure. This information is not intended to replace advice given to you by your health care provider. Make sure you discuss any questions you have with your health care provider. Document Revised: 08/27/2017 Document Reviewed: 11/25/2016 Elsevier Patient Education  2020 [employer]   Post Anesthesia Home Care Instructions  Activity: Get plenty of rest for the remainder of the day. A responsible individual must stay with you for 24 hours following the procedure.  For the next 24 hours, DO NOT: -Drive a car -Operate machinery -Drink alcoholic beverages -Take any medication unless instructed by your physician -Make any legal decisions or sign important papers.  Meals: Start with liquid foods such as gelatin or soup. Progress to regular foods as tolerated. Avoid greasy, spicy, heavy foods. If nausea and/or vomiting occur, drink only clear liquids until the nausea and/or vomiting subsides. Call your physician if vomiting continues.  Special Instructions/Symptoms: Your throat may feel dry or sore from the anesthesia or the breathing tube placed in your throat during  surgery. If this causes discomfort, gargle with warm salt water. The discomfort should disappear within 24 hours.  If you had a scopolamine patch placed behind your ear for the management of post- operative nausea and/or vomiting:  1. The medication in the patch is effective for 72 hours, after which it should be removed.  Wrap patch in a tissue and discard in the trash. Wash hands thoroughly with soap and water. 2. You may remove the patch earlier than 72 hours if you experience unpleasant side effects which may include dry mouth, dizziness or visual disturbances. 3. Avoid touching the patch. Wash your hands with soap and water after contact with the patch.     

## 2020-08-08 NOTE — Anesthesia Procedure Notes (Signed)
Procedure Name: Intubation Date/Time: 08/08/2020 7:39 AM Performed by: Suan Halter, CRNA Pre-anesthesia Checklist: Patient identified, Emergency Drugs available, Suction available and Patient being monitored Patient Re-evaluated:Patient Re-evaluated prior to induction Oxygen Delivery Method: Circle system utilized Preoxygenation: Pre-oxygenation with 100% oxygen Induction Type: IV induction Ventilation: Mask ventilation without difficulty Laryngoscope Size: Mac and 3 Grade View: Grade I Tube type: Oral Tube size: 7.0 mm Number of attempts: 1 Airway Equipment and Method: Stylet and Oral airway Placement Confirmation: ETT inserted through vocal cords under direct vision,  positive ETCO2 and breath sounds checked- equal and bilateral Secured at: 22 cm Tube secured with: Tape Dental Injury: Teeth and Oropharynx as per pre-operative assessment

## 2020-08-08 NOTE — Progress Notes (Signed)
Post-op check, [facility] Doing well, minimal pain, just fatigued, has ambulated, voiding ok, tol PO Afeb, VSS Abd- soft, incisions intact D/c home

## 2020-08-08 NOTE — Interval H&P Note (Signed)
History and Physical Interval Note:  08/08/2020 7:16 AM  Ann Leach  has presented today for surgery, with the diagnosis of menometrorrhagia.  The various methods of treatment have been discussed with the patient and family. After consideration of risks, benefits and other options for treatment, the patient has consented to  Procedure(s): LAPAROSCOPIC SUPRACERVICAL HYSTERECTOMY (N/A) LAPAROSCOPIC BILATERAL SALPINGECTOMY (Bilateral) as a surgical intervention.  The patient's history has been reviewed, patient examined, no change in status, stable for surgery.  I have reviewed the patient's chart and labs.  Questions were answered to the patient's satisfaction.     Leighton Roach Ayleen Mckinstry

## 2020-08-08 NOTE — Op Note (Signed)
Preoperative diagnosis: Menometrorrhagia Postoperative diagnosis: Same Procedure: Laparoscopic supracervical hysterectomy, bilateral salpingectomy Surgeon: Lavina Hamman M.D. Assistant: Doristine Counter, D.O. Anesthesia: Gen. Endotracheal tube Findings: She had a normal pelvis, prior BTL Specimens: Morcellated uterus for routine pathology Estimated blood loss: 100 cc Complications: None  Procedure in detail: The patient was taken to the operating room and placed in the dorsosupine position. General anesthesia was induced. Arms were tucked to her sides and legs were placed in mobile stirrups. Abdomen perineum and vagina were then prepped and draped in usual sterile fashion and a Foley catheter was inserted. Infraumbilical skin was infiltrated with quarter percent Marcaine and a 3 cm horizontal incision was made.  The fascia was elevated and entered sharply with scissors.  Peritoneum was then entered bluntly.  A pursestring suture of 0 Vicryl was then placed around the fascial incision.  The Hassan cannula with a balloon was then inserted and the abdomen was insufflated.  The laparoscope was inserted and confirmed good placement.  A 5 mm port was then placed on each side under direct visualization. Inspection revealed the above-mentioned findings.  The distal right fallopian tube was grasped and elevated.  Harmonic scalpel was used to remove the tube from the ovary and take down the mesosalpinx to the uterus.  The right uterine cornu was grasped with a single-tooth tenaculum from the left side. The Harmonic scalpel Ace was used to take down the right round ligament, utero-ovarian pedicle and broad ligament. The anterior peritoneum was incised across the anterior portion of the uterus to help release the bladder. Uterine artery artery was skeletonized and taken down with the harmonic scalpel Ace with adequate division and adequate hemostasis. A similar procedure was then performed on the patient's left side  taking down the mesosalpinx to free the tube, round ligament, utero-ovarian pedicle, and broad ligament. Anterior peritoneum was incised across the anterior portion the uterus to meet the incision coming from the patient's right side. Uterine artery was skeletonized and taken down with the Harmonic Scalpel with adequate division and adequate hemostasis. I then began to remove the uterus from the cervix using a drill, clamp, cut technique on maximum power, using the Harmonic scalpel Ace. This was done about halfway on the left side and then halfway on the right side removing the uterus from the cervix. Bleeding from the right uterine artery was controlled with harmonic scalpel along the way.  The cervical stump appeared to be hemostatic.  An Endocatch bag was inserted through the umbilical trocar into the pelvis.  The uterus was easily scooped into the bag and brought to the trocar.  The bag was released and the trocar was removed.  The small uterus was then easily morcellated and removed in the bag. The bag was removed and the umbilical trocar was replaced.  Pelvis was copiously irrigated. The cervical stump was hemostatic. A piece of Interceed was placed over the cervical stump. At this point all pedicles appeared to be hemostatic and there was no other pathology noted.  The 5 mm ports were removed under direct visualization all gas was allowed to deflate from the abdomen.  The umbilical trocar was removed.  The previously placed pursestring suture was tied and this achieved good fascial closure.  Skin incisions were closed with interrupted subcuticular sutures of 4-0 Vicryl followed by Dermabond. The patient tolerated the procedure well. She was taken to the recovery in stable condition. Counts were correct x2, she received Ancef 2 g IV the beginning of the procedure and  had PAS hose on throughout the procedure.

## 2020-08-09 ENCOUNTER — Encounter (HOSPITAL_BASED_OUTPATIENT_CLINIC_OR_DEPARTMENT_OTHER): Payer: Self-pay | Admitting: Obstetrics and Gynecology

## 2020-08-09 LAB — SURGICAL PATHOLOGY

## 2020-08-12 LAB — BPAM RBC
Blood Product Expiration Date: 202111212359
Blood Product Expiration Date: 202111262359
Unit Type and Rh: 6200
Unit Type and Rh: 6200

## 2020-08-12 LAB — TYPE AND SCREEN
ABO/RH(D): A POS
Antibody Screen: POSITIVE
Donor AG Type: NEGATIVE
Donor AG Type: NEGATIVE
Unit division: 0
Unit division: 0

## 2020-12-23 ENCOUNTER — Other Ambulatory Visit: Payer: Self-pay | Admitting: Family Medicine

## 2020-12-23 DIAGNOSIS — Z1231 Encounter for screening mammogram for malignant neoplasm of breast: Secondary | ICD-10-CM

## 2021-02-11 ENCOUNTER — Other Ambulatory Visit: Payer: Self-pay

## 2021-02-11 ENCOUNTER — Ambulatory Visit
Admission: RE | Admit: 2021-02-11 | Discharge: 2021-02-11 | Disposition: A | Discharge status: Home / Self Care | Payer: BC Managed Care – PPO | Source: Ambulatory Visit | Attending: Family Medicine | Admitting: Family Medicine

## 2021-02-11 DIAGNOSIS — Z1231 Encounter for screening mammogram for malignant neoplasm of breast: Secondary | ICD-10-CM

## 2021-02-12 ENCOUNTER — Other Ambulatory Visit: Payer: Self-pay | Admitting: Family Medicine

## 2021-02-12 DIAGNOSIS — R928 Other abnormal and inconclusive findings on diagnostic imaging of breast: Secondary | ICD-10-CM

## 2021-03-07 ENCOUNTER — Other Ambulatory Visit: Payer: Self-pay

## 2021-03-07 ENCOUNTER — Ambulatory Visit: Payer: BC Managed Care – PPO

## 2021-03-07 ENCOUNTER — Ambulatory Visit
Admission: RE | Admit: 2021-03-07 | Discharge: 2021-03-07 | Disposition: A | Discharge status: Home / Self Care | Payer: BC Managed Care – PPO | Source: Ambulatory Visit | Attending: Family Medicine | Admitting: Family Medicine

## 2021-03-07 DIAGNOSIS — R928 Other abnormal and inconclusive findings on diagnostic imaging of breast: Secondary | ICD-10-CM

## 2023-05-17 ENCOUNTER — Ambulatory Visit: Payer: BC Managed Care – PPO | Attending: Obstetrics and Gynecology | Admitting: Physical Therapy

## 2023-05-17 ENCOUNTER — Other Ambulatory Visit: Payer: Self-pay

## 2023-05-17 DIAGNOSIS — R293 Abnormal posture: Secondary | ICD-10-CM | POA: Diagnosis present

## 2023-05-17 DIAGNOSIS — M6281 Muscle weakness (generalized): Secondary | ICD-10-CM | POA: Diagnosis present

## 2023-05-17 DIAGNOSIS — R279 Unspecified lack of coordination: Secondary | ICD-10-CM | POA: Insufficient documentation

## 2023-05-17 NOTE — Therapy (Unsigned)
OUTPATIENT PHYSICAL THERAPY FEMALE PELVIC EVALUATION   Patient Name: Ann Leach MRN: [id number] DOB:[birth year], 42 y.o., female Today's Date: 05/17/2023  END OF SESSION:  PT End of Session - 05/17/23 1541     Visit Number 1    Date for PT Re-Evaluation 08/17/23    Authorization Type BC[employer]    PT Start Time 1445    PT Stop Time 1518    PT Time Calculation (min) 33 min    Activity Tolerance Patient tolerated treatment well    Behavior During Therapy WFL for tasks assessed/performed             Past Medical History:  Diagnosis Date   Anemia    after pregnancy   DVT (deep venous thrombosis) (HCC)    GERD (gastroesophageal reflux disease)    history of during and shortly after pregnancy   Heart murmur    told in high school and no issues   Medical history non-contributory    PONV (postoperative nausea and vomiting)    after spinal anesthesia   Seasonal allergies    Past Surgical History:  Procedure Laterality Date   bunionectomies     x2   CESAREAN SECTION     LAPAROSCOPIC BILATERAL SALPINGECTOMY Bilateral 08/08/2020   Procedure: LAPAROSCOPIC BILATERAL SALPINGECTOMY;  Surgeon: Lavina Hamman, MD;  Location[facility];  Service: Gynecology;  Laterality: Bilateral;   LAPAROSCOPIC SUPRACERVICAL HYSTERECTOMY N/A 08/08/2020   Procedure: LAPAROSCOPIC SUPRACERVICAL HYSTERECTOMY;  Surgeon: Lavina Hamman, MD;  Location: [facility];  Service: Gynecology;  Laterality: N/A;   TUBAL LIGATION     uterine ablation     Patient Active Problem List   Diagnosis Date Noted   Menometrorrhagia 08/08/2020   S/P laparoscopic supracervical hysterectomy 08/08/2020   Advanced maternal age in multigravida, unspecified trimester    [redacted] weeks gestation of pregnancy     PCP: Donzetta Sprung MD  REFERRING PROVIDER: Lavina Hamman, MD  REFERRING DIAG: N81.10 (ICD-10-CM) - Cystocele, unspecified  THERAPY DIAG:  Muscle weakness  (generalized)  Unspecified lack of coordination  Abnormal posture  Rationale for Evaluation and Treatment: Rehabilitation  ONSET DATE: 2018  SUBJECTIVE:                                                                                                                                                                                           SUBJECTIVE STATEMENT: Pt reports having urinary leakage with stressors, did have one instance of fecal leakage with holding it.   Fluid intake: Yes: water - not a lot; tea daily     PAIN:  Are you having pain? No   PRECAUTIONS:  None  RED FLAGS: None   WEIGHT BEARING RESTRICTIONS: No  FALLS:  Has patient fallen in last 6 months? No  LIVING ENVIRONMENT: Lives with: lives with their family Lives in: House/apartment  OCCUPATION: not currently   PLOF: Independent  PATIENT GOALS: to have stronger pelvic floor   PERTINENT HISTORY:  Menometrorrhagia   S/P laparoscopic supracervical hysterectomy, LSH, bilateral salpingectomy  Sexual abuse: No  BOWEL MOVEMENT: Pain with bowel movement: No Type of bowel movement:Type (Bristol Stool Scale) 4, Frequency daily, and Strain No Fully empty rectum: Yes:   Leakage: Yes: once with holding it too long Pads: No Fiber supplement: No  URINATION: Pain with urination: No Fully empty bladder: No not every time, needs to stand/sit to empty fully  Stream: Strong and Weak Urgency: No Frequency: not quicker than every 2 hours, 0-1 at night Leakage: Coughing and Sneezing Pads: No  INTERCOURSE: Pain with intercourse:  dryness but not pain, but some positions are worse than others Ability to have vaginal penetration:  Yes:   Climax: not painful  Marinoff Scale: 0/3  PREGNANCY: Vaginal deliveries 2 Tearing Yes: first only  C-section deliveries 1 (triplets)  Currently pregnant No  PROLAPSE: None   OBJECTIVE:   DIAGNOSTIC FINDINGS:   COGNITION: Overall cognitive status: Within functional  limits for tasks assessed     SENSATION: Light touch: Appears intact Proprioception: Appears intact  MUSCLE LENGTH: Bil hamstrings and adductors limited by 25%   POSTURE: posterior pelvic tilt  PELVIC ALIGNMENT: WFL  LUMBARAROM/PROM:  A/PROM A/PROM  eval  Flexion WFL  Extension WFL  Right lateral flexion WFL  Left lateral flexion WFL  Right rotation Limited by 25%  Left rotation Limited by 25%   (Blank rows = not tested)  LOWER EXTREMITY ROM:    LOWER EXTREMITY MMT:  Bil hips grossly 4/5, knees 5/5 PALPATION:   General  no TTP at abdomen, glutes, slightly tight bil lumbar paraspinals                External Perineal Exam WFL, slight dryness noted                             Internal Pelvic Floor no TTP  Patient confirms identification and approves PT to assess internal pelvic floor and treatment Yes No emotional/communication barriers or cognitive limitation. Patient is motivated to learn. Patient understands and agrees with treatment goals and plan. PT explains patient will be examined in standing, sitting, and lying down to see how their muscles and joints work. When they are ready, they will be asked to remove their underwear so PT can examine their perineum. The patient is also given the option of providing their own chaperone as one is not provided in our facility. The patient also has the right and is explained the right to defer or refuse any part of the evaluation or treatment including the internal exam. With the patient's consent, PT will use one gloved finger to gently assess the muscles of the pelvic floor, seeing how well it contracts and relaxes and if there is muscle symmetry. After, the patient will get dressed and PT and patient will discuss exam findings and plan of care. PT and patient discuss plan of care, schedule, attendance policy and HEP activities.   PELVIC MMT:   MMT eval  Vaginal 2/5, 2s, 5 reps  Internal Anal Sphincter   External Anal  Sphincter   Puborectalis   Diastasis Recti CHECK  NEXT SESSION  (Blank rows = not tested)        TONE: Slightly decreased   PROLAPSE: Possible grade   TODAY'S TREATMENT:                                                                                                                              DATE:   05/17/23 EVAL Examination completed, findings reviewed, pt educated on POC, HEP. Pt motivated to participate in PT and agreeable to attempt recommendations.     PATIENT EDUCATION:  Education details: [email or web address] Person educated: Patient Education method: Programmer, multimedia, Demonstration, Actor cues, Verbal cues, and Handouts Education comprehension: verbalized understanding and returned demonstration  HOME EXERCISE PROGRAM: [email or web address]  ASSESSMENT:  CLINICAL IMPRESSION: Patient is a 42 y.o. female  who was seen today for physical therapy evaluation and treatment for pelvic floor weakness. Pt has had 5 children, last delivery in 2018 to triplets. First two vaginally and triplets were c-section. Pt reports since their birth symptoms have started and progressively worsened. Pt found to have slightly decreased flexibility in spine and hips, decreased hip and core strength. Patient consented to internal pelvic floor assessment vaginally this date and found to have decreased strength, endurance, and coordination. Pt also had anterior wall laxity with hooklying position and cough noted. Pt educated on all findings and given HEP and reviewed. Pt would benefit from additional PT to further address deficits.      OBJECTIVE IMPAIRMENTS: decreased coordination, decreased endurance, decreased mobility, decreased strength, increased fascial restrictions, increased muscle spasms, impaired flexibility, improper body mechanics, and postural dysfunction.   ACTIVITY LIMITATIONS: carrying, lifting, squatting, continence, and locomotion level  PARTICIPATION LIMITATIONS: interpersonal relationship and community  activity  PERSONAL FACTORS: Time since onset of injury/illness/exacerbation and 1 comorbidity: medical history  are also affecting patient's functional outcome.   REHAB POTENTIAL: Good  CLINICAL DECISION MAKING: Stable/uncomplicated  EVALUATION COMPLEXITY: Low   GOALS: Goals reviewed with patient? Yes  SHORT TERM GOALS: Target date: 06/15/23  Pt to be I with HEP.  Baseline: Goal status: INITIAL  2.  Pt to demonstrate at least 3/5 pelvic floor strength for improved pelvic stability and decreased strain at pelvic floor/ decrease leakage.  Baseline:  Goal status: INITIAL  3.  Pt to demonstrate improved coordination of pelvic floor and breathing mechanics with 10# squat with appropriate synergistic patterns to decrease pain and leakage at least 75% of the time.    Baseline:  Goal status: INITIAL   LONG TERM GOALS: Target date: 08/17/23  Pt to be I with advanced HEP.  Baseline:  Goal status: INITIAL  2.  Pt to demonstrate at least 4/5 pelvic floor strength for improved pelvic stability and decreased strain at pelvic floor/ decrease leakage.  Baseline:  Goal status: INITIAL  3.  Pt to demonstrate improved coordination of pelvic floor and breathing mechanics with 30# squat with appropriate synergistic patterns to decrease pain and leakage at least 75% of the time.  Baseline:  Goal status: INITIAL  4.  Pt to demonstrate at least 5/5 bil hip strength for improved pelvic stability and functional squats without leakage.  Baseline:  Goal status: INITIAL  5.  Pt to be I with pressure management, lifting mechanics and voiding mechanics to decreased strain at pelvic floor.  Baseline:  Goal status: INITIAL   PLAN:  PT FREQUENCY: 1x/week  PT DURATION:  8 sessions  PLANNED INTERVENTIONS: Therapeutic exercises, Therapeutic activity, Neuromuscular re-education, Patient/Family education, Self Care, Joint mobilization, Dry Needling, Spinal mobilization, Cryotherapy, Moist heat,  scar mobilization, Taping, Biofeedback, Manual therapy, and Re-evaluation  PLAN FOR NEXT SESSION: check DRA, deeper assessment of c-section scar, breathing mechanics, voiding mechanics, pressure management, coordination of pelvic floor and breathing with and without exercise, hip/core/pelvic floor strengthening   Otelia Sergeant, PT, DPT 08/20/248:01 AM  [facility] [address], [address] [place], [place] [address] Phone # [phone] Fax [phone]

## 2023-06-30 ENCOUNTER — Encounter: Payer: Self-pay | Admitting: "Endocrinology

## 2023-06-30 ENCOUNTER — Ambulatory Visit (INDEPENDENT_AMBULATORY_CARE_PROVIDER_SITE_OTHER): Payer: BC Managed Care – PPO | Admitting: "Endocrinology

## 2023-06-30 VITALS — BP 103/70 | HR 75 | Ht 69.0 in | Wt 141.8 lb

## 2023-06-30 DIAGNOSIS — R7989 Other specified abnormal findings of blood chemistry: Secondary | ICD-10-CM

## 2023-06-30 NOTE — Patient Instructions (Signed)
  Control portions of carbohydrate - 30 grams/meal, 15 grams/snack. Choose low-glycemic carbohydrates. Avoid high-glycemic carbohydrates. Include (heart-healthy) fats in each meal or snack - 15 grams/meal, 5 grams/snack. Emphasize optimal protein intake. Space meals/snacks 3-4 hours apart. Avoid consuming liquids with meals. Avoid alcohol. Avoid caffeine. Maintain post-bariatric vitamin and mineral intake.    Low Glycemic Index Carbohydrates (CHOOSE) Steel-cut oats (regular, not quick-cook or instant) Oat bran cereal Beans/legumes (e.g., garbanzo, navy, kidney, lima, pinto, black-eyed and pea beans, edamame (soybeans), lentils Bean products (e.g., hummus, tofu) Pearled barley, cooked al dente Yams Some fruits (e.g., grapefruit, apples, pears, berries, apricots, peaches) Some pasta (e.g., Barilla Plus pasta), cooked al dente Some whole grain breads (e.g., Ezekiel bread, Joseph's Flax, Oat Bran & Whole Wheat Pita/Lavash/Tortillas) Some whole grain crackers (e.g., RyKrisp, RyVita, Wasa) Brown rice, wild rice Quinoa  Buckwheat (a grass)   High Glycemic Index Carbohydrates (AVOID) Refined breakfast cereals (e.g., Corn Flakes, Rice Krispies, Cream of Rice, instant oatmeal) Regular pasta Most starchy vegetables (e.g., white potatoes, corn, winter (orange) squash) White rice, rice cakes Popcorn, pretzels, chips Some fruits (e.g., ripe bananas, pineapple, mango, watermelon, grapes) All fruit juices and sweetened drinks (e.g., sodas, sweetened iced tea) Bread, rolls, bagels, English muffins, and crackers made with refined flour Sweets (e.g., candy, cake, cookies, ice cream, syrup)   Heart-Healthy Fats (Adapt) Nuts, nut butters Avocado, guacamole Olives Most plant oils (e.g., olive, canola, peanut, soy, sunflower, sesame) Most seeds (e.g., sunflower, flax, sesame/sesame tahini) Oily fish (e.g., salmon, bluefish, mackerel, tuna, sardines)

## 2023-06-30 NOTE — Progress Notes (Signed)
Outpatient Endocrinology Note Ann [place], MD    Ann Leach [birth year] [id number]  Referring Provider: Royann Shivers, * Primary Care Provider: Richardean Chimera, MD Reason for consultation: Subjective   Assessment & Plan  Diagnoses and all orders for this visit:  Low serum cortisol level -     Cortisol; Future -     ACTH; Future -     DHEA-sulfate; Future -     Comprehensive metabolic panel; Future   Patient here for ruling out low cortisol Patient had a test done herself at noon time with cortisol level coming back at 5.8, and DHEA sulfate coming at 0.6, ng/mL for both Patient reports having difficulty to function in the early morning and in the midday hours, feels better by noon and end of the day No problems with weight loss/abdominal pain/nausea/vomiting/diarrhea Patient tends to get good 7 to 8 hours sleep Ordered 8 AM cortisol, will follow-up with confirmatory testing if initial workup came back abnormal  Return in about 29 days (around 07/29/2023).   I have reviewed current medications, nurse's notes, allergies, vital signs, past medical and surgical history, family medical history, and social history for this encounter. Counseled patient on symptoms, examination findings, lab findings, imaging results, treatment decisions and monitoring and prognosis. The patient understood the recommendations and agrees with the treatment plan. All questions regarding treatment plan were fully answered.  Ann Cameron, MD  06/30/23   History of Present Illness HPI  Ann Leach is a 42 y.o. year old female who presents for evaluation of low cortisol.  Patient reports having a hard time getting up in morning, by noon feels better Has trouble concentrating at the end of the day, crash 2-4 pm By 8-9 pm feels better Sleeps at 10:30am-6/7 am Eats 3 meals, not a lot of protein, cannot tolerate chicken due to GI issues  No weight loss, reports gain of few  lbs No abd pain/nausea/vomiting/diarrhea   Physical Exam  BP 103/70   Pulse 75   Ht 5\' 9"  (1.753 m)   Wt 141 lb 12.8 oz (64.3 kg)   LMP  (LMP Unknown)   SpO2 98%   BMI 20.94 kg/m    Constitutional: well developed, well nourished Head: normocephalic, atraumatic Eyes: sclera anicteric, no redness Neck: supple Lungs: normal respiratory effort Neurology: alert and oriented Skin: dry, no appreciable rashes Musculoskeletal: no appreciable defects Psychiatric: normal mood and affect   Current Medications Patient's Medications  New Prescriptions   No medications on file  Previous Medications   CETIRIZINE (ZYRTEC) 10 MG TABLET    Take 10 mg by mouth daily.   FLUOXETINE (PROZAC) 10 MG CAPSULE    Take 10 mg by mouth daily.   IRON PO    Take by mouth.   OMEPRAZOLE (PRILOSEC) 40 MG CAPSULE    Take 40 mg by mouth daily.   OXYCODONE (OXY IR/ROXICODONE) 5 MG IMMEDIATE RELEASE TABLET    Take 1-2 tablets (5-10 mg total) by mouth every 4 (four) hours as needed for severe pain.   PRENATAL VIT-FE FUMARATE-FA (PRENATAL VITAMIN PO)    Take by mouth.   PROMETHAZINE HCL (PHENERGAN PO)    Take by mouth.  Modified Medications   No medications on file  Discontinued Medications   ASPIRIN EC 81 MG TABLET    Take 81 mg by mouth daily.   GABAPENTIN (NEURONTIN) 300 MG CAPSULE    Take 1 capsule (300 mg total) by mouth 3 (three) times daily for 2 days.  IBUPROFEN (ADVIL) 800 MG TABLET    Take 1 tablet (800 mg total) by mouth every 8 (eight) hours as needed for moderate pain.    Allergies Allergies  Allergen Reactions   Codeine Nausea Only    Past Medical History Past Medical History:  Diagnosis Date   Anemia    after pregnancy   DVT (deep venous thrombosis) (HCC)    GERD (gastroesophageal reflux disease)    history of during and shortly after pregnancy   Heart murmur    told in high school and no issues   Medical history non-contributory    PONV (postoperative nausea and vomiting)    after  spinal anesthesia   Seasonal allergies     Past Surgical History Past Surgical History:  Procedure Laterality Date   bunionectomies     x2   CESAREAN SECTION     LAPAROSCOPIC BILATERAL SALPINGECTOMY Bilateral 08/08/2020   Procedure: LAPAROSCOPIC BILATERAL SALPINGECTOMY;  Surgeon: Lavina Hamman, MD;  [facility];  Service: Gynecology;  Laterality: Bilateral;   LAPAROSCOPIC SUPRACERVICAL HYSTERECTOMY N/A 08/08/2020   Procedure: LAPAROSCOPIC SUPRACERVICAL HYSTERECTOMY;  Surgeon: Lavina Hamman, MD;  Location: [facility];  Service: Gynecology;  Laterality: N/A;   TUBAL LIGATION     uterine ablation      Family History family history includes Breast cancer in her maternal aunt and maternal grandmother; Other in her daughter and daughter.  Social History Social History   Socioeconomic History   Marital status: Married    Spouse name: Not on file   Number of children: Not on file   Years of education: Not on file   Highest education level: Not on file  Occupational History   Not on file  Tobacco Use   Smoking status: Never   Smokeless tobacco: Never  Vaping Use   Vaping status: Never Used  Substance and Sexual Activity   Alcohol use: No   Drug use: No   Sexual activity: Not on file  Other Topics Concern   Not on file  Social History Narrative   Not on file   Social Determinants of Health   Financial Resource Strain: Low Risk  (04/18/2018)   Received from [facility]   Overall Financial Resource Strain (CARDIA)    Difficulty of Paying Living Expenses: Not hard at all  Food Insecurity: No Food Insecurity (04/18/2018)   Received from [facility]   Hunger Vital Sign    Worried About Running Out of Food in the Last Year: Never true    Ran Out of Food in the Last Year: Never true  Transportation Needs: No Transportation Needs (04/18/2018)   Received from [facility]   PRAPARE - Transportation    Lack of Transportation  (Medical): No    Lack of Transportation (Non-Medical): No  Physical Activity: Sufficiently Active (04/18/2018)   Received from [facility]   Exercise Vital Sign    Days of Exercise per Week: 7 days    Minutes of Exercise per Session: 30 min  Stress: Not on file  Social Connections: Not on file  Intimate Partner Violence: Not At Risk (04/18/2018)   Received from [facility]   Humiliation, Afraid, Rape, and Kick questionnaire    Fear of Current or Ex-Partner: No    Emotionally Abused: No    Physically Abused: No    Sexually Abused: No    No results found for: "CHOL" No results found for: "HDL" No results found for: "LDLCALC"  No results found for: "TRIG" No results found for: "CHOLHDL" No results found for: "CREATININE" No results found for: "GFR" No results found for: "NA", "K", "CL", "CO2", "GLUCOSE", "BUN", "CREATININE", "CALCIUM", "PROT", "ALBUMIN", "AST", "ALT", "ALKPHOS", "BILITOT", "GFRNONAA", "GFRAA"     No data to display             Component Value Date/Time   WBC 5.2 07/29/2020 1413   RBC 3.99 07/29/2020 1413   HGB 12.3 07/29/2020 1413   HCT 37.0 07/29/2020 1413   PLT 176 07/29/2020 1413   MCV 92.7 07/29/2020 1413   MCH 30.8 07/29/2020 1413   MCHC 33.2 07/29/2020 1413   RDW 12.9 07/29/2020 1413   No results found for: "TSH", "FREET4"       Parts of this note may have been dictated using voice recognition software. There may be variances in spelling and vocabulary which are unintentional. Not all errors are proofread. Please notify the Thereasa Parkin if any discrepancies are noted or if the meaning of any statement is not clear.

## 2023-07-06 ENCOUNTER — Ambulatory Visit: Payer: BC Managed Care – PPO

## 2023-07-06 ENCOUNTER — Other Ambulatory Visit (INDEPENDENT_AMBULATORY_CARE_PROVIDER_SITE_OTHER): Payer: BC Managed Care – PPO

## 2023-07-06 DIAGNOSIS — R7989 Other specified abnormal findings of blood chemistry: Secondary | ICD-10-CM

## 2023-07-06 LAB — COMPREHENSIVE METABOLIC PANEL
ALT: 6 U/L (ref 0–35)
AST: 12 U/L (ref 0–37)
Albumin: 4.4 g/dL (ref 3.5–5.2)
Alkaline Phosphatase: 37 U/L — ABNORMAL LOW (ref 39–117)
BUN: 9 mg/dL (ref 6–23)
CO2: 24 meq/L (ref 19–32)
Calcium: 9.4 mg/dL (ref 8.4–10.5)
Chloride: 107 meq/L (ref 96–112)
Creatinine, Ser: 0.79 mg/dL (ref 0.40–1.20)
GFR: 92.13 mL/min (ref >60.00)
Glucose, Bld: 93 mg/dL (ref 70–99)
Potassium: 4 meq/L (ref 3.5–5.1)
Sodium: 141 meq/L (ref 135–145)
Total Bilirubin: 0.6 mg/dL (ref 0.2–1.2)
Total Protein: 7.2 g/dL (ref 6.0–8.3)

## 2023-07-06 LAB — CORTISOL: Cortisol, Plasma: 8.7 ug/dL

## 2023-07-10 LAB — ACTH: C206 ACTH: 13 pg/mL (ref 6–50)

## 2023-07-10 LAB — DHEA-SULFATE: DHEA-SO4: 65 ug/dL (ref 15–205)

## 2023-07-29 ENCOUNTER — Ambulatory Visit: Payer: BC Managed Care – PPO | Admitting: "Endocrinology

## 2023-08-09 ENCOUNTER — Ambulatory Visit: Payer: BC Managed Care – PPO
